# Patient Record
Sex: Female | Born: 1980 | Race: White | Hispanic: No | Marital: Single | Smoking: Former smoker
Health system: Southern US, Community
[De-identification: ages and names within clinical notes are randomized; demographics above are authoritative.]

## PROBLEM LIST (undated history)

## (undated) DIAGNOSIS — F410 Panic disorder [episodic paroxysmal anxiety] without agoraphobia: Secondary | ICD-10-CM

## (undated) DIAGNOSIS — F1910 Other psychoactive substance abuse, uncomplicated: Secondary | ICD-10-CM

## (undated) DIAGNOSIS — I10 Essential (primary) hypertension: Secondary | ICD-10-CM

## (undated) DIAGNOSIS — F418 Other specified anxiety disorders: Secondary | ICD-10-CM

## (undated) DIAGNOSIS — F419 Anxiety disorder, unspecified: Secondary | ICD-10-CM

## (undated) DIAGNOSIS — F329 Major depressive disorder, single episode, unspecified: Secondary | ICD-10-CM

## (undated) DIAGNOSIS — F32A Depression, unspecified: Secondary | ICD-10-CM

## (undated) DIAGNOSIS — K769 Liver disease, unspecified: Secondary | ICD-10-CM

## (undated) HISTORY — PX: CHOLECYSTECTOMY: SHX55

## (undated) HISTORY — DX: Liver disease, unspecified: K76.9

## (undated) HISTORY — DX: Other psychoactive substance abuse, uncomplicated: F19.10

## (undated) HISTORY — DX: Essential (primary) hypertension: I10

## (undated) HISTORY — PX: OTHER SURGICAL HISTORY: SHX169

## (undated) HISTORY — DX: Anxiety disorder, unspecified: F41.9

---

## 1999-09-04 ENCOUNTER — Emergency Department (HOSPITAL_COMMUNITY): Admission: EM | Admit: 1999-09-04 | Discharge: 1999-09-04 | Payer: Self-pay | Admitting: Emergency Medicine

## 1999-09-04 ENCOUNTER — Encounter: Payer: Self-pay | Admitting: Emergency Medicine

## 1999-12-05 ENCOUNTER — Other Ambulatory Visit: Admission: RE | Admit: 1999-12-05 | Discharge: 1999-12-05 | Payer: Self-pay | Admitting: Orthopedic Surgery

## 2001-12-01 ENCOUNTER — Other Ambulatory Visit: Admission: RE | Admit: 2001-12-01 | Discharge: 2001-12-01 | Payer: Self-pay | Admitting: Gynecology

## 2001-12-21 HISTORY — PX: LAPAROSCOPIC GASTRIC BYPASS: SUR771

## 2001-12-21 HISTORY — PX: GASTRIC BYPASS: SHX52

## 2002-12-21 HISTORY — PX: PANNICULECTOMY: SHX5360

## 2003-10-18 ENCOUNTER — Other Ambulatory Visit: Admission: RE | Admit: 2003-10-18 | Discharge: 2003-10-18 | Payer: Self-pay | Admitting: Gynecology

## 2006-07-14 ENCOUNTER — Emergency Department (HOSPITAL_COMMUNITY): Admission: EM | Admit: 2006-07-14 | Discharge: 2006-07-14 | Payer: Self-pay | Admitting: Family Medicine

## 2007-07-15 ENCOUNTER — Other Ambulatory Visit: Admission: RE | Admit: 2007-07-15 | Discharge: 2007-07-15 | Payer: Self-pay | Admitting: Obstetrics and Gynecology

## 2007-12-22 HISTORY — PX: LAPAROSCOPIC CHOLECYSTECTOMY: SUR755

## 2008-02-01 ENCOUNTER — Inpatient Hospital Stay (HOSPITAL_COMMUNITY): Admission: AD | Admit: 2008-02-01 | Discharge: 2008-02-04 | Payer: Self-pay | Admitting: Obstetrics and Gynecology

## 2008-04-08 ENCOUNTER — Inpatient Hospital Stay (HOSPITAL_COMMUNITY): Admission: EM | Admit: 2008-04-08 | Discharge: 2008-04-10 | Payer: Self-pay | Admitting: Emergency Medicine

## 2008-04-09 ENCOUNTER — Encounter (INDEPENDENT_AMBULATORY_CARE_PROVIDER_SITE_OTHER): Payer: Self-pay | Admitting: General Surgery

## 2008-04-09 ENCOUNTER — Encounter: Payer: Self-pay | Admitting: General Surgery

## 2008-04-18 ENCOUNTER — Ambulatory Visit: Payer: Self-pay | Admitting: Gastroenterology

## 2008-08-14 ENCOUNTER — Other Ambulatory Visit: Admission: RE | Admit: 2008-08-14 | Discharge: 2008-08-14 | Payer: Self-pay | Admitting: Obstetrics and Gynecology

## 2009-09-10 IMAGING — US US ABDOMEN COMPLETE
1 series · 14 of 25 positions shown · non-contrast
Comparison: None

CLINICAL DATA: Abdominal pain and vomiting

ABDOMEN ULTRASOUND
TECHNIQUE: Complete abdominal ultrasound examination was performed
including evaluation of the liver, gallbladder, bile ducts,
pancreas, kidneys, spleen, IVC, and abdominal aorta.

[Series 1: unknown · 0.34mm/px · 14 of 75 slices shown]
[im 1/75]
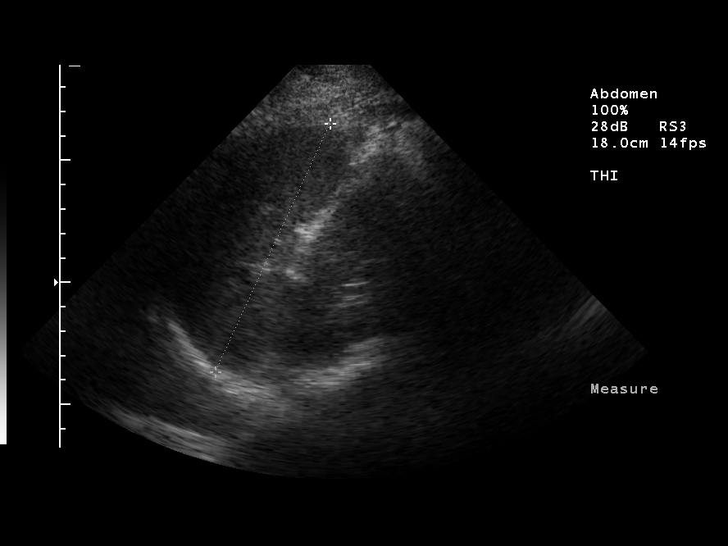
[im 7/75]
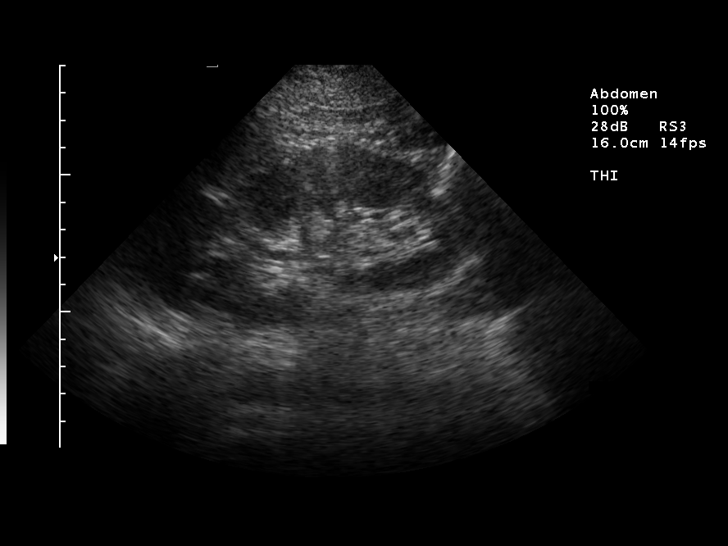
[im 13/75]
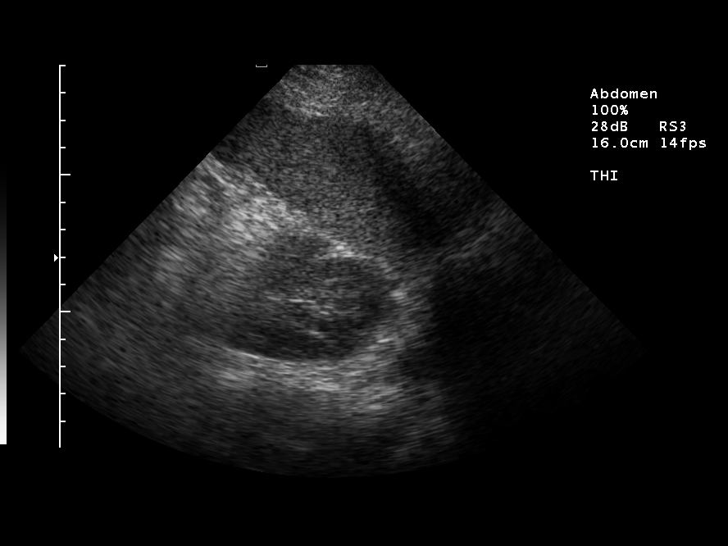
[im 19/75]
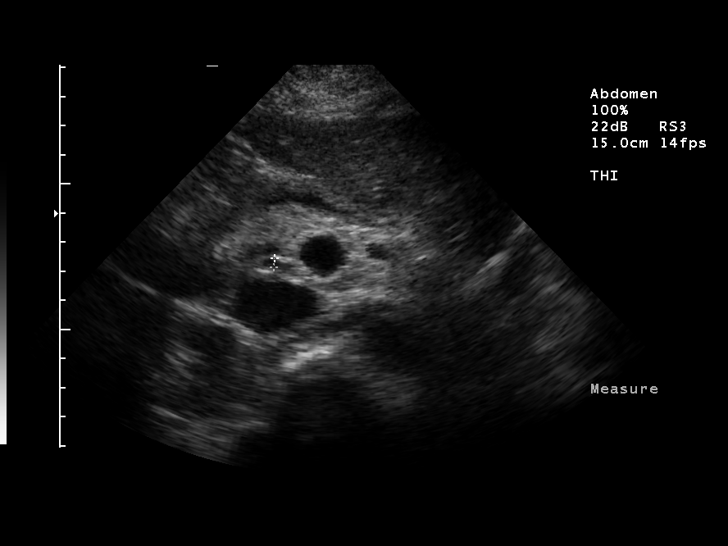
[im 25/75]
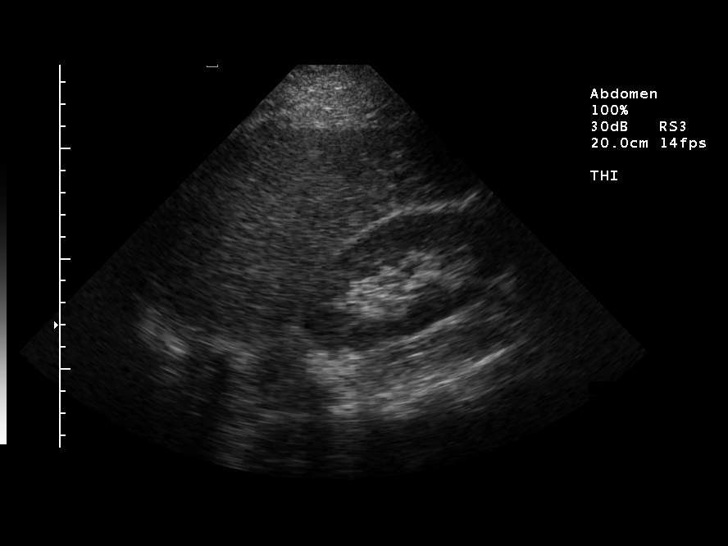
[im 28/75]
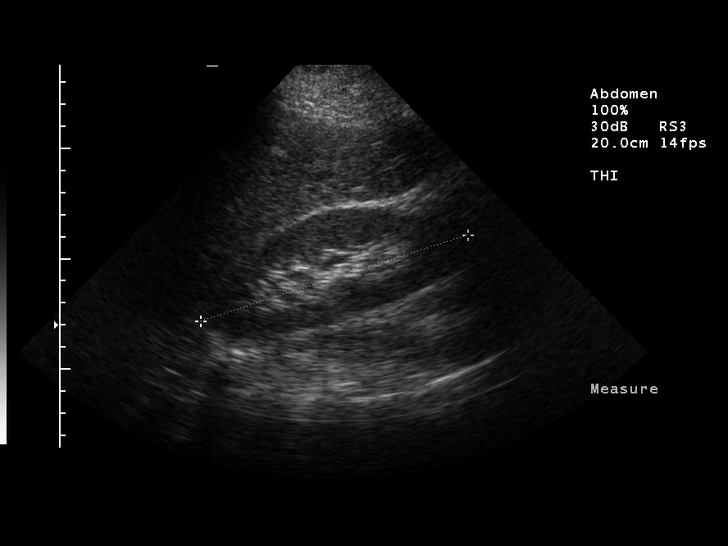
[im 34/75]
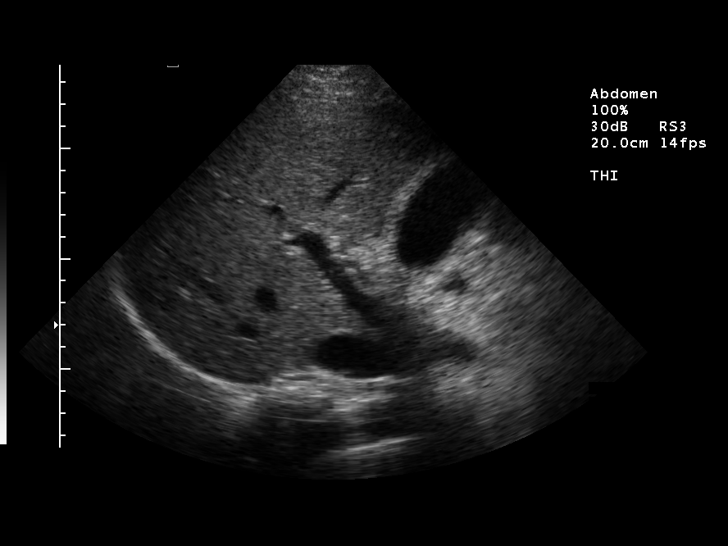
[im 41/75]
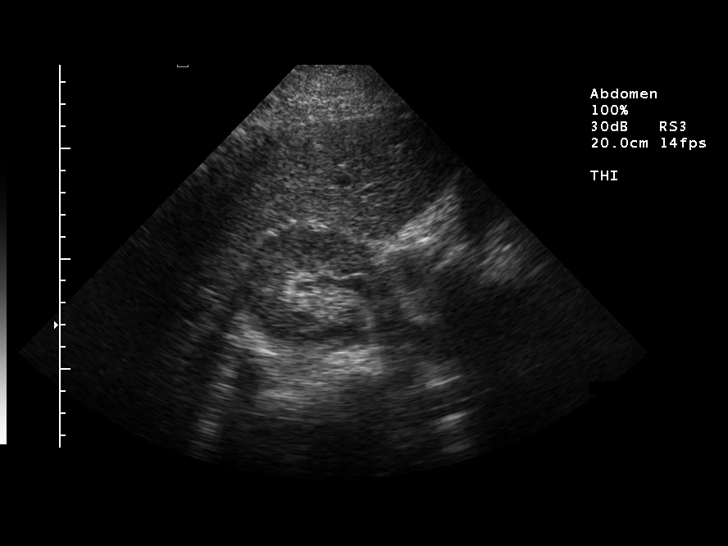
[im 47/75]
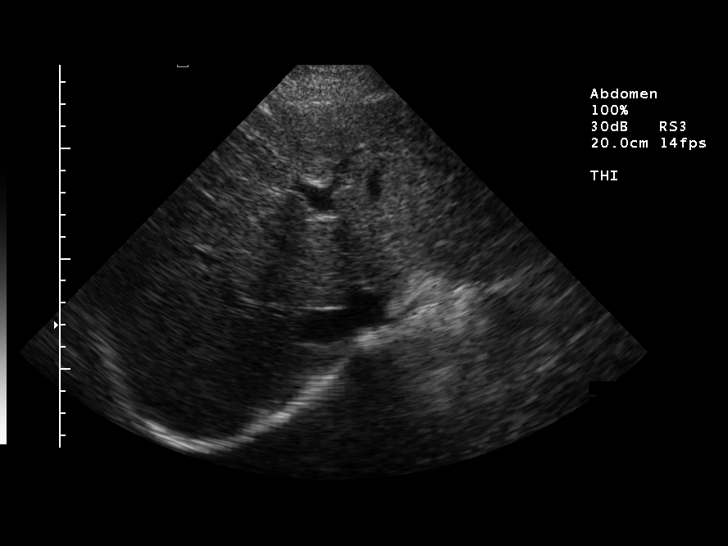
[im 50/75]
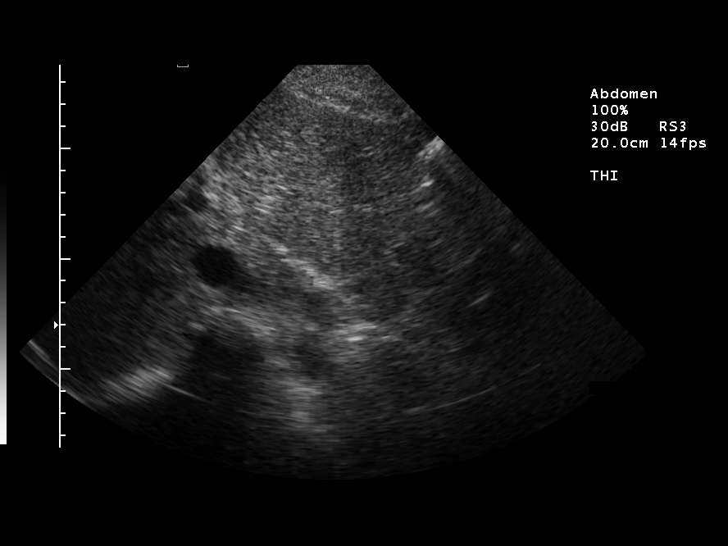
[im 56/75]
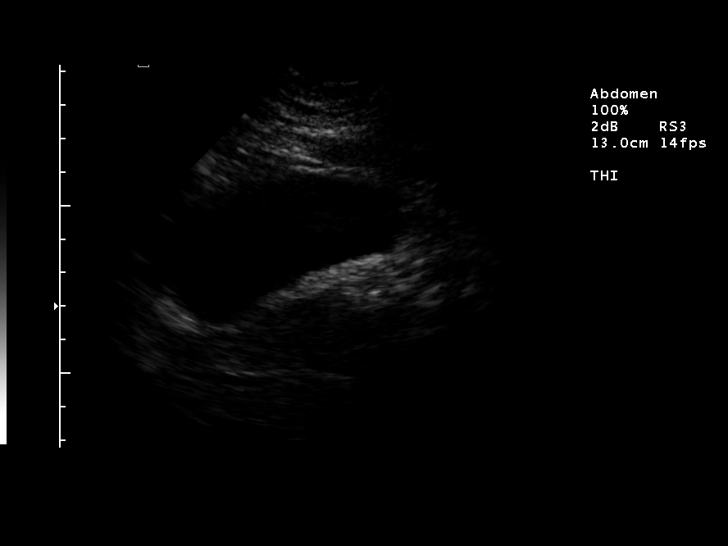
[im 62/75]
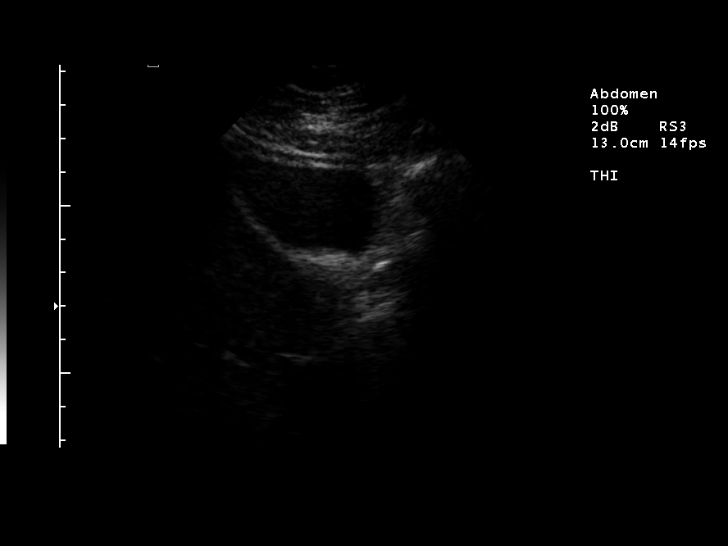
[im 68/75]
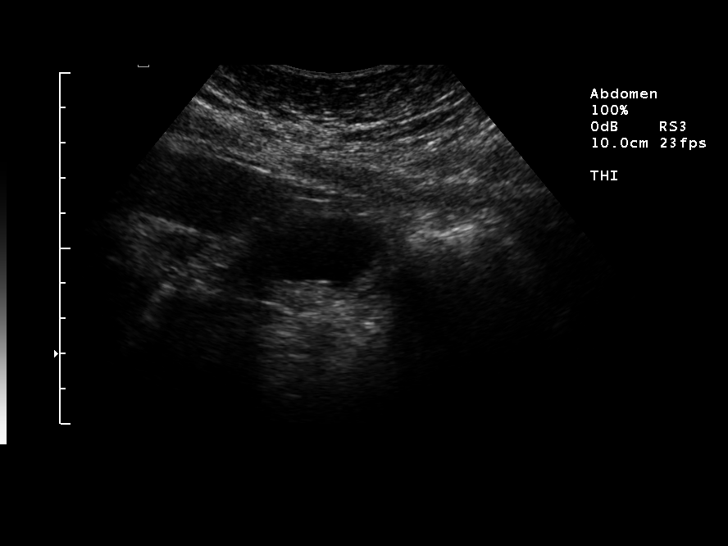
[im 75/75]
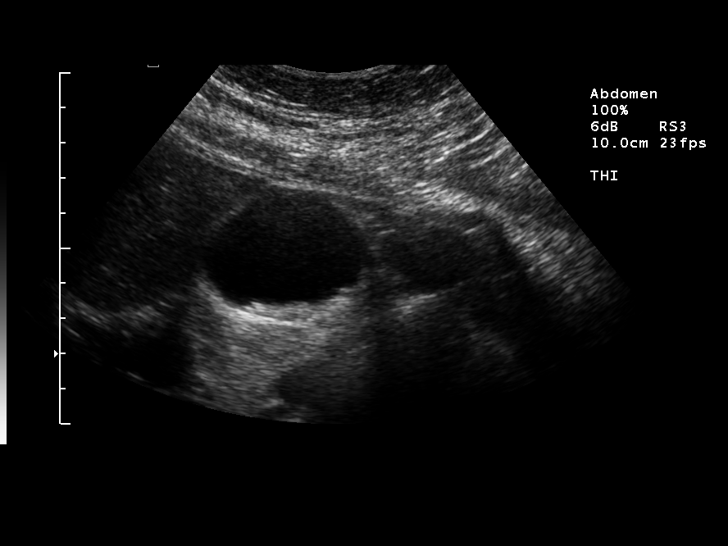

[14 of 25 positions shown; findings below may reference images not displayed]

FINDINGS: Multiple tiny gallstones and small amount of sludge noted
within the gallbladder.  There is no evidence of gallbladder wall
thickening or pericholecystic fluid.  There is no evidence of
biliary ductal dilatation of common bile duct measuring 4 mm.
Liver is normal in parenchymal echogenicity, and no liver lesions
are identified.  Visualized portions of the IVC and pancreas are
unremarkable.

There is no evidence of splenomegaly.  Both kidneys are normal in
size and appearance.  There is no evidence of abdominal aortic
aneurysm.
IMPRESSION: 1.  Tiny gallstones and gallbladder sludge.  No evidence of
gallbladder wall thickening or pericholecystic fluid.
2.  No evidence of biliary ductal dilatation.

## 2011-05-05 NOTE — Op Note (Signed)
NAMEBERLYN, SAYLOR                  ACCOUNT NO.:  [id number]   MEDICAL RECORD NO.:  [id number]          PATIENT TYPE:  INP   LOCATION:  1519                         FACILITY:  [facility]   PHYSICIAN:  Anselm Pancoast. Weatherly, M.D.DATE OF BIRTH:  [birth year]   DATE OF PROCEDURE:  04/09/2008  DATE OF DISCHARGE:                               OPERATIVE REPORT   PREOPERATIVE DIAGNOSIS:  Chronic cholecystitis with jaundice, probable  common duct stone.   POSTOPERATIVE DIAGNOSIS:  Cholecystitis with small stones.  Cholangiogram was essentially normal after initial spasm.   OPERATIONS:  Laparoscopic cholecystectomy with cholangiogram status post  previous Roux-en-Y gastric bypass and also abdominoplasty.   HISTORY:  Tandi Hanko is a 30 year old Caucasian female who presented to  the emergency room on Saturday night with severe episodes of epigastric  pain that ultimately radiated to the back and with the following  history.  She had a gastric bypass at [facility] in 2001.  She had also had  abdominoplasty approximately two years later.  Her weight had gone from  about 275 to about 165, and then recently, she has had a pregnancy which  she delivered vaginally approximately 6 weeks earlier.  She presently  weighs about 200 pounds and described the pain as epigastric, then pain  radiating to her back.  Her liver tests were definitely abnormal and her  SGOT was about 1200.  Her bilirubin was only mildly elevated, and the  pain when I saw her approximately 4:30 a.m. her pain essentially was  greatly decreased.  She had received some IV pain medication.  I advised  that we continue the IVs and repeat a set of liver function studies at  approximately 7:00 a.m., and they he actually drew it at approximately  8:00 a.m., and on the repeat blood work, it was still abnormal.  I have  gone over to [facility] to operate, and Dr. Daphine Deutscher saw her and went  ahead and admitted her and arranged for nuclear medicine ERCP to  be  performed, and this showed thickening of the gallbladder.  She had an  ultrasound that showed small stones and kind of contracted gallbladder  but no dilated common bile duct and no abnormality in the pancreas.  We  got Dr. Loreta Ave to see her, and she of course with the Roux-en-Y is not  able to do an ERCP, and the discussion or whether she should be sent to  [facility] where she had the Roux-en-Y, etc., was kind of discussed.  This  morning, she had repeat liver function studies, and again the bilirubin  now was 3.5, and she is clinically obviously jaundiced, and her other  liver tests are still abnormal.  She was seen again today with Dr. Russella Dar  who was in agreement that we did not really have a good explanation,  thought that this is probably gallstones in nature, and we recommended  to proceed with a laparoscopic cholecystectomy versus an open  cholecystectomy, and if she has common duct stones, we will decide if it  can done laparoscopically or whether an open common duct exploration  will be needed.  With this and the patient in agreement, she was added  to the OR schedule for late this evening.  Preoperatively, she was not  given additional antibiotics since she is receiving antibiotics on the  floor.   Positioned on the OR table, induction general anesthesia endotracheal  tube, and no NG tube.  Did place a Foley catheter, and the abdomen was  prepped with Betadine surgical scrub solution and draped in sterile  manner.  We first used a 5-mm OptiVu and tried to enter through the  upper right quadrant of the abdomen.  I was kind of too cautious, and  Dr. Ezzard Standing who has done this many more times than I have with the OptiVu  and the Roux-en-Y bypass entered the peritoneal cavity without  difficulty, and fortunately there was no adhesions from previous  surgery.  With the OptiVu 5-mm, we could then see in the peritoneal  cavity.  She does have adhesions from where the falciform ligament  had  been taken down, and these were taken down after we got a 10/11 port in  the upper midline under direct vision.  After then the adhesions, we  then placed the second 10-mm port with direct view through the little  incision that goes where the umbilicus had been transplanted and was  guided into the peritoneal cavity easily.  The 5-mm port lateral from  Dr. Ezzard Standing was also placed, and then we carefully took the adhesions  down around this inflamed gallbladder, and she did have subacute  cholecystitis.  The cystic duct could be identified, and this was  encompassed and clipped flush with the cystic duct gallbladder, and then  a little opening made, the [facility] catheter introduced, and held in place  with a clip.  I had taken down the cystic artery under direct vision,  double clipped it proximally and singly distally and divided it before  doing cholangiogram.  With injection of the dye, the cystic duct enters  kind of low, and at first, the bile duct was kind of dilated, and you  could not see any flow into the duodenum, and then there was kind of a  pop radiologically looking at it, and dye just freely goes to the  duodenum afterwards easily.  We did a second view and watched the dye in  the duodenum going down into the more distal portion of the small bowel,  could not actually follow it down to the Roux-en-Y limb, however.  I  then removed the catheter, triply clipped the cystic duct and divided  it, and then very consciously removed the gallbladder from its bed  predominantly with hook electrocautery, good hemostasis being obtained.  Fluid was irrigated and no evidence of any active bleeding.  Where we  had taken down adhesions appeared dry, and we placed the gallbladder in  the EndoCatch bag.  I then switched the camera to the upper 10 mL port  and grabbed the bag containing the gallbladder, brought it through the  fascia, and then used a Kelly on the outside of the bag and kind of   stretched the opening since it had been placed percutaneously and the  bag with the gallbladder slipped on out easily.  I later opened the  gallbladder on the back table, and there are little stones about a  millimeter or so in size, and I expect that she had a little stone that  was kind impacted in the distal bile duct.  The  radiologist, Dr. Rosalie Gums reviewed the cholangiogram, and she thought it was unremarkable.  The irrigating and then the little fluid had been aspirated.  The 5-mm  ports were removed under direct vision, and we used Endoloop to put two  sutures in the little fascial defect.  The 10-mm port in the upper  abdomen was withdrawn.  No sutures were place there.  The subcutaneous  wounds were closed with 4-0 Vicryl, Benzoin and Steri-Strips on skin.  The patient tolerated the procedure nicely, and we will repeat liver  function studies in the morning.  Hopefully, she will not have much  pain, and if there is any improvement in the liver function studies, we  will let her be discharged and seen in the office in approximately one  week.           ______________________________  Anselm Pancoast. Zachery Dakins, M.D.     WJW/MEDQ  D:  04/09/2008  T:  04/09/2008  Job:  [id number]   cc:   Venita Lick. Russella Dar, MD, FACG  [address]  [place]  [place] [address]

## 2011-09-11 LAB — CREATININE, SERUM
Creatinine, Ser: 0.74
GFR calc Af Amer: >60

## 2011-09-11 LAB — CBC
HCT: 40.2
Hemoglobin: 13.8
MCHC: 34.4
MCHC: 34.7
MCV: 95.5
MCV: 96.3
Platelets: 163
Platelets: 198
RBC: 4.21
RDW: 12.6
WBC: 11.3 — ABNORMAL HIGH

## 2011-09-15 LAB — COMPREHENSIVE METABOLIC PANEL
ALT: 294 — ABNORMAL HIGH
ALT: 851 — ABNORMAL HIGH
AST: 100 — ABNORMAL HIGH
AST: 1032 — ABNORMAL HIGH
AST: 1172 — ABNORMAL HIGH
Albumin: 3.2 — ABNORMAL LOW
Albumin: 3.9
Albumin: 4
Alkaline Phosphatase: 152 — ABNORMAL HIGH
BUN: 4 — ABNORMAL LOW
CO2: 26
CO2: 26
CO2: 27
Calcium: 8.6
Calcium: 8.8
Chloride: 101
Chloride: 102
Chloride: 104
Creatinine, Ser: 0.65
Creatinine, Ser: 0.82
GFR calc Af Amer: >60
GFR calc Af Amer: >60
GFR calc Af Amer: >60
GFR calc non Af Amer: >60
GFR calc non Af Amer: >60
GFR calc non Af Amer: >60
Glucose, Bld: 120 — ABNORMAL HIGH
Potassium: 4.2
Potassium: 4.4
Sodium: 135
Sodium: 138
Total Bilirubin: 1.5 — ABNORMAL HIGH
Total Bilirubin: 2.8 — ABNORMAL HIGH
Total Protein: 6.1

## 2011-09-15 LAB — DIFFERENTIAL
Basophils Absolute: 0
Eosinophils Relative: 0
Lymphocytes Relative: 7 — ABNORMAL LOW
Monocytes Absolute: 0.5
Monocytes Relative: 5

## 2011-09-15 LAB — URINALYSIS, ROUTINE W REFLEX MICROSCOPIC
Glucose, UA: NEGATIVE
Hgb urine dipstick: NEGATIVE
Specific Gravity, Urine: 1.023
pH: 6.5

## 2011-09-15 LAB — CBC
MCV: 93.4
Platelets: 219
WBC: 9.3

## 2015-12-19 ENCOUNTER — Emergency Department (HOSPITAL_COMMUNITY)
Admission: EM | Admit: 2015-12-19 | Discharge: 2015-12-19 | Disposition: A | Discharge status: Other Acute Inpatient Hospital | Payer: BC Managed Care – PPO | Attending: Emergency Medicine | Admitting: Emergency Medicine

## 2015-12-19 ENCOUNTER — Inpatient Hospital Stay (HOSPITAL_COMMUNITY)
Admission: AD | Admit: 2015-12-19 | Discharge: 2015-12-23 | DRG: 885 | Disposition: A | Discharge status: Home / Self Care | Payer: BC Managed Care – PPO | Source: Intra-hospital | Attending: Psychiatry | Admitting: Psychiatry

## 2015-12-19 ENCOUNTER — Encounter (HOSPITAL_COMMUNITY): Payer: Self-pay | Admitting: Emergency Medicine

## 2015-12-19 ENCOUNTER — Encounter (HOSPITAL_COMMUNITY): Payer: Self-pay

## 2015-12-19 DIAGNOSIS — F17200 Nicotine dependence, unspecified, uncomplicated: Secondary | ICD-10-CM | POA: Diagnosis present

## 2015-12-19 DIAGNOSIS — X58XXXA Exposure to other specified factors, initial encounter: Secondary | ICD-10-CM | POA: Insufficient documentation

## 2015-12-19 DIAGNOSIS — F332 Major depressive disorder, recurrent severe without psychotic features: Secondary | ICD-10-CM | POA: Diagnosis present

## 2015-12-19 DIAGNOSIS — F1010 Alcohol abuse, uncomplicated: Secondary | ICD-10-CM

## 2015-12-19 DIAGNOSIS — F322 Major depressive disorder, single episode, severe without psychotic features: Principal | ICD-10-CM | POA: Diagnosis present

## 2015-12-19 DIAGNOSIS — Z9884 Bariatric surgery status: Secondary | ICD-10-CM | POA: Diagnosis not present

## 2015-12-19 DIAGNOSIS — Y998 Other external cause status: Secondary | ICD-10-CM | POA: Insufficient documentation

## 2015-12-19 DIAGNOSIS — Y9389 Activity, other specified: Secondary | ICD-10-CM | POA: Diagnosis not present

## 2015-12-19 DIAGNOSIS — R112 Nausea with vomiting, unspecified: Secondary | ICD-10-CM | POA: Diagnosis not present

## 2015-12-19 DIAGNOSIS — Y9289 Other specified places as the place of occurrence of the external cause: Secondary | ICD-10-CM | POA: Insufficient documentation

## 2015-12-19 DIAGNOSIS — F411 Generalized anxiety disorder: Secondary | ICD-10-CM | POA: Diagnosis present

## 2015-12-19 DIAGNOSIS — F325 Major depressive disorder, single episode, in full remission: Secondary | ICD-10-CM | POA: Diagnosis present

## 2015-12-19 DIAGNOSIS — T50902A Poisoning by unspecified drugs, medicaments and biological substances, intentional self-harm, initial encounter: Secondary | ICD-10-CM

## 2015-12-19 DIAGNOSIS — Z3202 Encounter for pregnancy test, result negative: Secondary | ICD-10-CM | POA: Insufficient documentation

## 2015-12-19 DIAGNOSIS — T424X2A Poisoning by benzodiazepines, intentional self-harm, initial encounter: Secondary | ICD-10-CM | POA: Insufficient documentation

## 2015-12-19 DIAGNOSIS — F331 Major depressive disorder, recurrent, moderate: Secondary | ICD-10-CM | POA: Diagnosis present

## 2015-12-19 DIAGNOSIS — R45851 Suicidal ideations: Secondary | ICD-10-CM | POA: Diagnosis not present

## 2015-12-19 HISTORY — DX: Depression, unspecified: F32.A

## 2015-12-19 HISTORY — DX: Panic disorder (episodic paroxysmal anxiety): F41.0

## 2015-12-19 HISTORY — DX: Major depressive disorder, single episode, unspecified: F32.9

## 2015-12-19 LAB — HEPATIC FUNCTION PANEL
ALK PHOS: 68 U/L (ref 38–126)
ALT: 87 U/L — AB (ref 14–54)
AST: 119 U/L — ABNORMAL HIGH (ref 15–41)
Albumin: 3.9 g/dL (ref 3.5–5.0)
BILIRUBIN DIRECT: 0.2 mg/dL (ref 0.1–0.5)
BILIRUBIN INDIRECT: 0.4 mg/dL (ref 0.3–0.9)
BILIRUBIN TOTAL: 0.6 mg/dL (ref 0.3–1.2)
Total Protein: 7.2 g/dL (ref 6.5–8.1)

## 2015-12-19 LAB — CBC WITH DIFFERENTIAL/PLATELET
BASOS ABS: 0 10*3/uL (ref 0.0–0.1)
Basophils Relative: 1 %
EOS ABS: 0.1 10*3/uL (ref 0.0–0.7)
EOS PCT: 2 %
HCT: 42.7 % (ref 36.0–46.0)
Hemoglobin: 15 g/dL (ref 12.0–15.0)
LYMPHS PCT: 41 %
Lymphs Abs: 2.7 10*3/uL (ref 0.7–4.0)
MCH: 36.8 pg — ABNORMAL HIGH (ref 26.0–34.0)
MCHC: 35.1 g/dL (ref 30.0–36.0)
MCV: 104.7 fL — AB (ref 78.0–100.0)
MONO ABS: 0.7 10*3/uL (ref 0.1–1.0)
Monocytes Relative: 11 %
Neutro Abs: 3 10*3/uL (ref 1.7–7.7)
Neutrophils Relative %: 45 %
PLATELETS: 217 10*3/uL (ref 150–400)
RBC: 4.08 MIL/uL (ref 3.87–5.11)
RDW: 13.2 % (ref 11.5–15.5)
WBC: 6.5 10*3/uL (ref 4.0–10.5)

## 2015-12-19 LAB — RAPID URINE DRUG SCREEN, HOSP PERFORMED
AMPHETAMINES: NOT DETECTED
BARBITURATES: NOT DETECTED
BENZODIAZEPINES: NOT DETECTED
COCAINE: NOT DETECTED
Opiates: NOT DETECTED
TETRAHYDROCANNABINOL: NOT DETECTED

## 2015-12-19 LAB — ETHANOL: ALCOHOL ETHYL (B): 196 mg/dL — AB (ref <5)

## 2015-12-19 LAB — I-STAT CHEM 8, ED
BUN: <3 mg/dL — ABNORMAL LOW (ref 6–20)
CALCIUM ION: 1.09 mmol/L — AB (ref 1.12–1.23)
Chloride: 104 mmol/L (ref 101–111)
Creatinine, Ser: 0.9 mg/dL (ref 0.44–1.00)
GLUCOSE: 105 mg/dL — AB (ref 65–99)
HCT: 48 % — ABNORMAL HIGH (ref 36.0–46.0)
HEMOGLOBIN: 16.3 g/dL — AB (ref 12.0–15.0)
POTASSIUM: 3.3 mmol/L — AB (ref 3.5–5.1)
Sodium: 143 mmol/L (ref 135–145)
TCO2: 23 mmol/L (ref 0–100)

## 2015-12-19 LAB — SALICYLATE LEVEL: Salicylate Lvl: <4 mg/dL (ref 2.8–30.0)

## 2015-12-19 LAB — POC URINE PREG, ED: PREG TEST UR: NEGATIVE

## 2015-12-19 LAB — ACETAMINOPHEN LEVEL: Acetaminophen (Tylenol), Serum: <10 ug/mL — ABNORMAL LOW (ref 10–30)

## 2015-12-19 MED ORDER — ONDANSETRON HCL 4 MG PO TABS: 4.0000 mg | ORAL_TABLET | Freq: Three times a day (TID) | ORAL | Status: DC | PRN

## 2015-12-19 MED ORDER — ACETAMINOPHEN 325 MG PO TABS: 650.0000 mg | ORAL_TABLET | ORAL | Status: DC | PRN

## 2015-12-19 MED ORDER — IBUPROFEN 600 MG PO TABS
600.0000 mg | ORAL_TABLET | Freq: Three times a day (TID) | ORAL | Status: DC | PRN
  Administered 2015-12-20: 600 mg via ORAL
  Filled 2015-12-19: qty 1

## 2015-12-19 MED ORDER — SODIUM CHLORIDE 0.9 % IV BOLUS (SEPSIS)
1000.0000 mL | Freq: Once | INTRAVENOUS | Status: AC
  Administered 2015-12-19: 1000 mL via INTRAVENOUS

## 2015-12-19 MED ORDER — IBUPROFEN 200 MG PO TABS
600.0000 mg | ORAL_TABLET | Freq: Three times a day (TID) | ORAL | Status: DC | PRN
  Administered 2015-12-19: 600 mg via ORAL
  Filled 2015-12-19: qty 3

## 2015-12-19 MED ORDER — SODIUM CHLORIDE 0.9 % IV BOLUS (SEPSIS)
1000.0000 mL | Freq: Once | INTRAVENOUS | Status: AC
Start: 2015-12-19 — End: 2015-12-19
  Administered 2015-12-19: 1000 mL via INTRAVENOUS

## 2015-12-19 MED ORDER — HYDROXYZINE HCL 25 MG PO TABS: 25.0000 mg | ORAL_TABLET | Freq: Three times a day (TID) | ORAL | Status: DC | PRN

## 2015-12-19 MED ORDER — ALUM & MAG HYDROXIDE-SIMETH 200-200-20 MG/5ML PO SUSP: 30.0000 mL | ORAL | Status: DC | PRN

## 2015-12-19 MED ORDER — IBUPROFEN 200 MG PO TABS: 600.0000 mg | ORAL_TABLET | Freq: Three times a day (TID) | ORAL | Status: DC | PRN

## 2015-12-19 MED ORDER — SERTRALINE HCL 50 MG PO TABS
50.0000 mg | ORAL_TABLET | Freq: Every day | ORAL | Status: DC
  Administered 2015-12-19: 50 mg via ORAL
  Filled 2015-12-19: qty 1

## 2015-12-19 NOTE — ED Notes (Signed)
Patty from poison control stated she was closing patient case. Medically cleared from poison control.

## 2015-12-19 NOTE — ED Notes (Signed)
Patient admits to SI but denies HI and AVH. Plan of care discussed with patient. Patient voices no complaints or concerns at this time. Patient given a pitcher of ice water. Encouragement and support provided and safety maintain. Q 15 min safety checks remain in place.

## 2015-12-19 NOTE — ED Notes (Signed)
Added pt's earrings to personal belongings bag.  Pt escorted to [facility].

## 2015-12-19 NOTE — ED Notes (Signed)
Pt shirt, leggings, boots, coat , small pocket book, cell phone with charger and 2 banks cards and [place] ID all stuck in the case of cell phone all placed in personal belongings bag placed at nurses station by room #17
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
## 2015-12-19 NOTE — ED Notes (Signed)
Pt is resting comfortably in bed, in view of the nurse's station, in no acute distress.  Vitals remain WNL.  Belongings and room have been secured.

## 2015-12-19 NOTE — ED Notes (Signed)
Family at bedside per pt request.  Will draw labs when visitor leaves.

## 2015-12-19 NOTE — BH Assessment (Signed)
[facility] Assessment Progress Note  Per Thedore MinsMojeed Akintayo, MD, this pt requires psychiatric hospitalization at this time.  Brandi Heinrichina Tate, RN, [facility].  Pt has signed Voluntary Admission and Consent for Treatment, as well as Consent to Release Information to [facility], her former outpatient provider, and [facility].  Pt's nurse, Rudean HittDawnaly, has been notified, and agrees to send original paperwork along with pt via [facility], and to call report to [phone].  Doylene Canninghomas Daysie Helf, MA Triage Specialist [phone]
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
## 2015-12-19 NOTE — ED Notes (Signed)
Patient discharged to [facility].  Left the unit ambulatory with [facility] [employer].  All belongings given to the driver.  Patient continues to be tearful at times and states she realizes she needs help and has waited too long to admit it.

## 2015-12-19 NOTE — ED Notes (Addendum)
Called Poison Control; per CJ, if labs are normal then we should expect the pt to be lethargic for several more hours.  Provide supportive care until she is awake.  If labs are abnormal, call back for additional recommendations.

## 2015-12-19 NOTE — Progress Notes (Signed)
Adult Psychoeducational Group Note  Date:  12/19/2015 Time:  10:27 PM  Group Topic/Focus:  Wrap-Up Group:   The focus of this group is to help patients review their daily goal of treatment and discuss progress on daily workbooks.  Participation Level:  Active  Participation Quality:  Appropriate  Affect:  Appropriate  Cognitive:  Alert  Insight: Appropriate  Engagement in Group:  Engaged  Modes of Intervention:  Discussion  Additional Comments:  Patient stated her goal for today was to be positive, and to set goals of what she expects in the future. Patient stated something positive that happened today was her parents coming to visit.   Osa Fogarty L Khriz Liddy 12/19/2015, 10:27 PM
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
## 2015-12-19 NOTE — ED Notes (Signed)
Bed: WA17 Expected date:  Expected time:  Means of arrival:  Comments: EMS 8636 F ingestion (15) 1mg  Klonopin/etoh

## 2015-12-20 ENCOUNTER — Encounter (HOSPITAL_COMMUNITY): Payer: Self-pay | Admitting: Psychiatry

## 2015-12-20 DIAGNOSIS — R45851 Suicidal ideations: Secondary | ICD-10-CM

## 2015-12-20 DIAGNOSIS — F411 Generalized anxiety disorder: Secondary | ICD-10-CM

## 2015-12-20 DIAGNOSIS — F332 Major depressive disorder, recurrent severe without psychotic features: Secondary | ICD-10-CM | POA: Diagnosis present

## 2015-12-20 MED ORDER — LORAZEPAM 1 MG PO TABS: 1.0000 mg | ORAL_TABLET | Freq: Four times a day (QID) | ORAL | Status: DC | PRN

## 2015-12-20 MED ORDER — ADULT MULTIVITAMIN W/MINERALS CH
1.0000 | ORAL_TABLET | Freq: Every day | ORAL | Status: DC
  Administered 2015-12-20 – 2015-12-23 (×4): 1 via ORAL
  Filled 2015-12-20 (×7): qty 1

## 2015-12-20 MED ORDER — VITAMIN B-1 100 MG PO TABS
100.0000 mg | ORAL_TABLET | Freq: Every day | ORAL | Status: DC
  Administered 2015-12-21 – 2015-12-23 (×3): 100 mg via ORAL
  Filled 2015-12-20 (×5): qty 1

## 2015-12-20 MED ORDER — HYDROXYZINE HCL 25 MG PO TABS: 25.0000 mg | ORAL_TABLET | Freq: Four times a day (QID) | ORAL | Status: DC | PRN

## 2015-12-20 MED ORDER — THIAMINE HCL 100 MG/ML IJ SOLN
100.0000 mg | Freq: Once | INTRAMUSCULAR | Status: AC
  Administered 2015-12-20: 100 mg via INTRAMUSCULAR
  Filled 2015-12-20: qty 2

## 2015-12-20 MED ORDER — POTASSIUM CHLORIDE CRYS ER 10 MEQ PO TBCR
10.0000 meq | EXTENDED_RELEASE_TABLET | Freq: Two times a day (BID) | ORAL | Status: AC
  Administered 2015-12-20 – 2015-12-21 (×3): 10 meq via ORAL
  Filled 2015-12-20 (×3): qty 1

## 2015-12-20 MED ORDER — SERTRALINE HCL 50 MG PO TABS
50.0000 mg | ORAL_TABLET | Freq: Every day | ORAL | Status: DC
  Administered 2015-12-20 – 2015-12-23 (×4): 50 mg via ORAL
  Filled 2015-12-20: qty 1
  Filled 2015-12-20: qty 7
  Filled 2015-12-20 (×5): qty 1

## 2015-12-20 MED ORDER — LOPERAMIDE HCL 2 MG PO CAPS
2.0000 mg | ORAL_CAPSULE | ORAL | Status: DC | PRN
Start: 2015-12-20 — End: 2015-12-23

## 2015-12-20 MED ORDER — ONDANSETRON 4 MG PO TBDP: 4.0000 mg | ORAL_TABLET | Freq: Four times a day (QID) | ORAL | Status: DC | PRN

## 2015-12-20 NOTE — Progress Notes (Signed)
D: Brandi DandyMary is resting in her room. She denies SI/HI/AVH. Contracts for safety. She attended group this evening however she was late due to napping earlier in the day. A: Q 15 min checks for patient safety. Encouragement and support given.  R: Continue to monitor for patient safety.
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
## 2015-12-20 NOTE — [facility] Suicide Risk Assessment (Addendum)
[facility] INPATIENT:  Family/Significant Other Suicide Prevention Education  Suicide Prevention Education:  Contact Attempts Carlie Dunstan-Sister [phone]) has been identified by the patient as the family member/significant other with whom the patient will be residing, and identified as the person(s) who will aid the patient in the event of a mental health crisis.  With written consent from the patient, two attempts were made to provide suicide prevention education, prior to and/or following the patient's discharge.  We were unsuccessful in providing suicide prevention education.  A suicide education pamphlet was given to the patient to share with family/significant other.  Date and time of first attempt: 12/20/15 11:40 AM Date and time of second attempt: 12/20/15 15:58 PM  Attempt made third time by Brandi MantleMareida Grossman-Orr, Brandi James - 12/22/15 at 4:05 PM  [name], [name] 12/20/2015, 11:39 AM

## 2015-12-20 NOTE — [facility] Group Notes (Signed)
[facility] LCSW Group Therapy  12/20/2015 2:27 PM  Type of Therapy: Group Therapy- Feelings Around Relapse and Recovery  Participation Level: Active   Participation Quality: Appropriate  Affect: Appropriate  Cognitive: Alert and Oriented   Insight: Developing   Engagement in Therapy: Developing/Improving and Engaged   Modes of Intervention: Clarification, Confrontation, Discussion, Education, Exploration, Limit-setting, Orientation, Problem-solving, Rapport Building, Dance movement psychotherapisteality Testing, Socialization and Support  Summary of Progress/Problems: The topic for today was feelings about relapse. The group discussed what relapse prevention is to them and identified triggers that they are on the path to relapse. Members also processed their feeling towards relapse and were able to relate to common experiences. Group also discussed coping skills that can be used for relapse prevention. Corrie DandyMary was observed to be tearful in group as she discussed her trigger to relapse which was grief. She shared how her ex-husband passed away unexpectedly and that she has not been able to work through his passing due to focusing on helping her son get through this challenging time. Corrie DandyMary ended group stating her desire to follow through grief counseling in order to resolve some of these issues.    Therapeutic Modalities:  Cognitive Behavioral Therapy Solution-Focused Therapy Assertiveness Training Relapse Prevention Therapy  [name], Henery Betzold C 12/20/2015, 2:27 PM

## 2015-12-20 NOTE — [facility] Group Notes (Signed)
[facility]  12/20/2015 8:45 AM  Participation Quality: Alert, Appropriate and Oriented  Mood/Affect: Appropriate  Depression Rating: 0  Anxiety Rating: 0  Thoughts of Suicide: Pt denies SI/HI  Will you contract for safety? Yes  Current AVH: Pt denies  Plan for Discharge/Comments: Pt attended discharge planning group and actively participated in group. CSW discussed suicide prevention education with the group and encouraged them to discuss discharge planning and any relevant barriers. Pt reports a good night's sleep and expresses that she is feeling much better this morning.   Transportation Means: Pt reports access to transportation  Supports: No supports mentioned at this time  Chad CordialLauren Carter, LCSWA 12/20/2015 10:06 AM

## 2015-12-20 NOTE — Progress Notes (Signed)
Adult Psychoeducational Group Note  Date:  12/20/2015 Time:  9:05 PM  Group Topic/Focus:  Wrap-Up Group:   The focus of this group is to help patients review their daily goal of treatment and discuss progress on daily workbooks.  Participation Level:  Active  Participation Quality:  Appropriate  Affect:  Appropriate  Cognitive:  Alert  Insight: Appropriate  Engagement in Group:  Engaged  Modes of Intervention:  Discussion  Additional Comments:  Patient stated having a good day. Patient stated she had a good time associating with other patients. Patient also stated her family came to visit her today.   August Gosser L Konnar Ben 12/20/2015, 9:05 PM
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
## 2015-12-20 NOTE — Tx Team (Signed)
Interdisciplinary Treatment Plan Update (Adult) Date: 12/20/2015   Date: 12/20/2015 11:05 AM  Progress in Treatment:  Attending groups: Yes  Participating in groups: Yes  Taking medication as prescribed: Yes  Tolerating medication: Yes  Family/Significant othe contact made: No, CSW attempting to make contact with sister Patient understands diagnosis: Yes Discussing patient identified problems/goals with staff: Yes  Medical problems stabilized or resolved: Yes  Denies suicidal/homicidal ideation: Yes Patient has not harmed self or Others: Yes   New problem(s) identified: None identified at this time.   Discharge Plan or Barriers: Pt will return home and follow-up with [facility] and [place]  Additional comments:  Patient and CSW reviewed pt's identified goals and treatment plan. Patient verbalized understanding and agreed to treatment plan. CSW reviewed [facility] "Discharge Process and Patient Involvement" Form. Pt verbalized understanding of information provided and signed form.   Reason for Continuation of Hospitalization:  Anxiety Depression Medication stabilization Suicidal ideation   Estimated length of stay: 3-5 days  Review of initial/current patient goals per problem list:   1.  Goal(s): Patient will participate in aftercare plan  Met:  Yes  Target date: 3-5 days from date of admission   As evidenced by: Patient will participate within aftercare plan AEB aftercare provider and housing plan at discharge being identified.   12/20/15: Pt will return home and follow-up with [facility] [place]  2.  Goal (s): Patient will exhibit decreased depressive symptoms and suicidal ideations.  Met:  Yes  Target date: 3-5 days from date of admission   As evidenced by: Patient will utilize self rating of depression at 3 or below and demonstrate decreased signs of depression or be deemed stable for discharge by MD.  12/20/15: Pt rates depression  at 0/10; denies SI. Observed to be tearful  3.  Goal(s): Patient will demonstrate decreased signs and symptoms of anxiety.  Met:  Progressing  Target date: 3-5 days from date of admission   As evidenced by: Patient will utilize self rating of anxiety at 3 or below and demonstrated decreased signs of anxiety, or be deemed stable for discharge by MD  12/20/15: Pt rates anxiety at 0/10  Attendees:  Patient:    Family:    Physician: Dr. Parke Poisson, MD  12/20/2015 11:05 AM  Nursing: Lars Pinks, RN Case manager  12/20/2015 11:05 AM  Clinical Social Worker Peri Maris, [place] 12/20/2015 11:05 AM  Other: Boyce Medici, LCSW 12/20/2015 11:05 AM  Clinical:  Gaylan Gerold, RN 12/20/2015 11:05 AM  Other: , RN Charge Nurse 12/20/2015 11:05 AM  Other:     [place] Cage, Jonah Nestle C 12/20/2015

## 2015-12-20 NOTE — Progress Notes (Signed)
D:Madelyne states today was "good". When I questioned what made it good she states "I was more sociable today with my peers and that is a good change for me". She continues to work on her goals and coping skills. She states "Right now I am in a controlled environment and things are easy. I need to work on how I am going to cope with stress outside of a controlled environment. She rates Anxiety 1/10 Depression 1/10 and Hopelessness 0/10. Denies SI/HI/AVH at this time.  A: Q 15 minute checks for patient safety. Encouragement and support given.  R: Continue to monitor for patient safety and medication effectiveness.

## 2015-12-20 NOTE — Progress Notes (Signed)
Patient ID: Brandi CortiMary E Mcenroe, female   DOB: 1981/03/22, 34 y.o.   MRN: [id number]  Pt currently presents with an anxious affect and pleasant behavior. Per self inventory, pt rates depression at a 8, hopelessness 0 and anxiety 4. Pt's daily goal is to "make a plan of positive changes/shedule - work on positive thinking" and they intend to do so by "write it down! Make daily goal/short long term." Pt reports good sleep, a fair appetite, normal energy and good concentration. Pt also writes "Holidays were rough, more than usual after loss/death and helping son grieve with my own grief has been difficult."  Pt provided with medications per providers orders. Pt's labs and vitals were monitored throughout the day. Pt supported emotionally and encouraged to express concerns and questions. Pt educated on medications.  Pt's safety ensured with 15 minute and environmental checks. Pt currently denies SI/HI and A/V hallucinations. Pt verbally agrees to seek staff if SI/HI or A/VH occurs and to consult with staff before acting on these thoughts. Pt is an active part in the milieu today, attends groups. Will continue POC.
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
## 2015-12-21 LAB — TSH: TSH: 4.036 u[IU]/mL (ref 0.350–4.500)

## 2015-12-21 NOTE — Progress Notes (Signed)
D) Pt has been attending the groups and interacting with her peers. Affect and mood are appropriate. Pt rates her depression between a 0-1, hopelessness at a 0 and her anxiety as a 0-1. Has participated in the groups. Denies SI and HI.  A) Pt given support, reassurance and praise. Encouragement provided. Provided with a 1:1. R) Denies SI and HI.

## 2015-12-21 NOTE — [facility] Group Notes (Signed)
Adult Psychoeducational Group Note  Date:  12/21/2015 Time:  9:27 PM  Group Topic/Focus:  Wrap-Up Group:   The focus of this group is to help patients review their daily goal of treatment and discuss progress on daily workbooks.  Participation Level:  Active  Participation Quality:  Appropriate  Affect:  Appropriate  Cognitive:  Appropriate  Insight: Good  Engagement in Group:  Engaged  Modes of Intervention:  Discussion  Additional Comments:  Patient stated her day was good and she laughed James lot.  She is discharging tomorrow.  Her goal for the new year is to make better decisions.    [name], Brandi James 12/21/2015, 9:27 PM

## 2015-12-21 NOTE — [facility] Group Notes (Signed)
[facility] Group Notes:  (Nursing/MHT/Case Management/Adjunct)  Date:  12/21/2015  Time:  2:27 PM  Type of Therapy:  Psychoeducational Skills  Participation Level:  Active  Participation Quality:  Appropriate  Affect:  Appropriate  Cognitive:  Appropriate  Insight:  Appropriate  Engagement in Group:  Engaged  Modes of Intervention:  Discussion  Summary of Progress/Problems: Pt did attend self inventory group.   Akash Winski Shanta 12/21/2015, 2:27 PM 

## 2015-12-21 NOTE — Progress Notes (Signed)
[facility]  12/21/2015 4:01 PM Brandi CortiMary E James  MRN:  [id number]  Subjective:  Brandi DandyMary says, "I'm feeling a lot better. I'm here because I thought I took too many of my anxiety pills. I was drinking at the time. I wanted to sleep too. When I checked my bottle, it was almost empty. I panicked, thinking I had taken these pills. I called for help. I'm really not doing well at the time. I was dealing with grief. My son's father passed away in July of this year of heart attack. A good father, good man & a good friend. I miss him a lot. I'm doing much better. I signed my 72 hours already, will be due in the morning. I would like to be discharged in am to go home, get ready to go back to school on Monday. 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 Brandi PenSherry denies any SIHI, AVH.  Principal Problem: Severe episode of recurrent major depressive disorder, without psychotic features (HCC)  Diagnosis:   Patient Active Problem List   Diagnosis Date Noted  . Severe episode of recurrent major depressive disorder, without psychotic features (HCC) [F33.2]   . Severe major depression without psychotic features (HCC) [F32.2] 12/19/2015  . Generalized anxiety disorder [F41.1] 12/19/2015   Total Time spent with patient: 20 minutes  Past Psychiatric History: Anxiety disorder  Past Medical History:  Past Medical History  Diagnosis Date  . Panic attacks   . Panic disorder   . Depression     Past Surgical History  Procedure Laterality Date  . Gastric bypass  2003  . Abdominoplasty     Family History: History reviewed. No pertinent family history. Family Psychiatric  History: See H&P  Social History:  History  Alcohol Use  . 3.6 oz/week  . 6 Glasses of wine per week     History  Drug Use No    Social History   Social History  . Marital Status: Single    Spouse Name: N/A  . Number of Children: N/A  . Years of Education: N/A   Social History Main Topics  . Smoking  status: Current Some Day Smoker  . Smokeless tobacco: Never Used  . Alcohol Use: 3.6 oz/week    6 Glasses of wine per week  . Drug Use: No  . Sexual Activity: Yes   Other Topics Concern  . None   Social History Narrative   Additional Social History:   Sleep: Fair  Appetite:  Fair  Current Medications: Current Facility-Administered Medications  Medication Dose Route Frequency Provider Last Rate Last Dose  . acetaminophen (TYLENOL) tablet 650 mg  650 mg Oral Q4H PRN Earney NavyJosephine C Onuoha, NP      . alum & mag hydroxide-simeth (MAALOX/MYLANTA) 200-200-20 MG/5ML suspension 30 mL  30 mL Oral PRN Earney NavyJosephine C Onuoha, NP      . hydrOXYzine (ATARAX/VISTARIL) tablet 25 mg  25 mg Oral Q6H PRN Craige CottaFernando A Cobos, MD      . ibuprofen (ADVIL,MOTRIN) tablet 600 mg  600 mg Oral Q8H PRN Earney NavyJosephine C Onuoha, NP   600 mg at 12/20/15 1517  . loperamide (IMODIUM) capsule 2-4 mg  2-4 mg Oral PRN Craige CottaFernando A Cobos, MD      . LORazepam (ATIVAN) tablet 1 mg  1 mg Oral Q6H PRN Craige CottaFernando A Cobos, MD      . multivitamin with minerals tablet 1 tablet  1 tablet Oral Daily Craige CottaFernando A Cobos, MD  1 tablet at 12/21/15 0757  . ondansetron (ZOFRAN) tablet 4 mg  4 mg Oral Q8H PRN Earney Navy, NP      . ondansetron (ZOFRAN-ODT) disintegrating tablet 4 mg  4 mg Oral Q6H PRN Rockey Situ Cobos, MD      . potassium chloride (K-DUR,KLOR-CON) CR tablet 10 mEq  10 mEq Oral BID Craige Cotta, MD   10 mEq at 12/21/15 0757  . sertraline (ZOLOFT) tablet 50 mg  50 mg Oral Daily Craige Cotta, MD   50 mg at 12/21/15 0757  . thiamine (VITAMIN B-1) tablet 100 mg  100 mg Oral Daily Craige Cotta, MD   100 mg at 12/21/15 1478    Lab Results:  Results for orders placed or performed during the hospital encounter of 12/19/15 (from the past 48 hour(s))  TSH     Status: None   Collection Time: 12/21/15  6:38 AM  Result Value Ref Range   TSH 4.036 0.350 - 4.500 uIU/mL    Comment: Performed at [facility]     Physical Findings: AIMS: Facial and Oral Movements Muscles of Facial Expression: None, normal Lips and Perioral Area: None, normal Jaw: None, normal Tongue: None, normal,Extremity Movements Upper (arms, wrists, hands, fingers): None, normal Lower (legs, knees, ankles, toes): None, normal, Trunk Movements Neck, shoulders, hips: None, normal, Overall Severity Severity of abnormal movements (highest score from questions above): None, normal Incapacitation due to abnormal movements: None, normal Patient's awareness of abnormal movements (rate only patient's report): No Awareness, Dental Status Current problems with teeth and/or dentures?: No Does patient usually wear dentures?: No  CIWA:  CIWA-Ar Total: 0 COWS:  COWS Total Score: 0  Musculoskeletal: Strength & Muscle Tone: within normal limits Gait & Station: normal Patient leans: N/A  Psychiatric Specialty Exam: Review of Systems  Constitutional: Negative.   HENT: Negative.   Eyes: Negative.   Respiratory: Negative.   Cardiovascular: Negative.   Gastrointestinal: Negative.   Genitourinary: Negative.   Musculoskeletal: Negative.   Skin: Negative.   Neurological: Negative.   Endo/Heme/Allergies: Negative.   Psychiatric/Behavioral: Positive for depression ("Improving"). Negative for suicidal ideas, hallucinations, memory loss and substance abuse. The patient is nervous/anxious ("Improving"). The patient does not have insomnia.     Blood pressure 112/78, pulse 84, temperature 98.1 F (36.7 C), temperature source Oral, resp. rate 18, height  (1.651 m), weight 99.791 kg (220 lb), last menstrual period 11/21/2015, SpO2 99 %.Body mass index is 36.61 kg/(m^2).  General Appearance: Casual and Fairly Groomed  Eye Contact:: Good  Speech: Clear and Coherent and Normal Rate  Volume: Normal  Mood: Anxious and Depressed  Affect: Appropriate, Congruent and Depressed  Thought Process: Coherent, Goal Directed, Linear and  Logical  Orientation: Full (Time, Place, and Person)  Thought Content: WDL  Suicidal Thoughts: Yes. with intent/plan  Homicidal Thoughts: No  Memory: Immediate; Fair Recent; Fair Remote; Fair  Judgement: Fair  Insight: Fair  Psychomotor Activity: Normal  Concentration: Fair  Recall: [employer] of Knowledge:Fair  Language: Fair  Akathisia: No  Handed:   AIMS (if indicated):    Assets: Communication Skills Desire for Improvement Resilience Social Support  ADL's: Intact  Cognition: WNL  Sleep:            Treatment [place] 1. Continue crisis management, mood stabilization & relapse prevention.. 2. Continue current medication management to reduce current symptoms to base line and improve the  patient's overall level of functioning; Sertraline 50 mg for depression,  Lorazepam 1 mg for anxiety, Hydroxyzine 25 mg for anxiety. 3. Treat health problems as indicated. 4. Develop treatment plan to enhance medication adeherance upon discharge and the need for  readmission. 5. Psycho-social education regarding relapse prevention and self care.  Sanjuana Kava, PMHN, FNP-BC 12/21/2015, 4:01 PM    I agree with assessment and plan Madie Reno A. Dub Mikes, M.D.

## 2015-12-21 NOTE — [facility] Group Notes (Signed)
[facility] Group Notes:  (Clinical Social Work)   12/21/2015 10:00-11:00AM  Summary of Progress/Problems: In today's process group a decisional balance exercise was used to explore in depth the perceived benefits and costs of unhealthy coping techniques, as well as the benefits and costs of replacing these with healthy coping skills. Among the unhealthy coping techniques listed by the group were drinking, isolating, overtaking pain medications, directing anger inward, trying to control everything, stopping medications, denying grief, rumination, and anger outbursts. Motivational Interviewing and the whiteboard were utilized for the exercises. The patient stated her grief has not been handled, and therefore she has been isolating herself rather than deal with the grief.  She called it "facing my giants."  She was interactive with others in group, supportive of them.  She acknowledged that for herself, a routine/schedule will be an important part of her recovery plan at discharge.  Type of Therapy:  Group Therapy - Process   Participation Level:  Active  Participation Quality:  Attentive, Sharing and Supportive  Affect:  Blunted  Cognitive:  Appropriate and Oriented  Insight:  Engaged  Engagement in Therapy:  Engaged  Modes of Intervention:  Education, Motivational Interviewing  Ambrose MantleMareida Grossman-Orr, LCSW 12/21/2015, 12:33 PM

## 2015-12-22 NOTE — Progress Notes (Signed)
Patient stated [facility].  That she plans to return to her parents' home and will be seeing her son today.  Patient stated she does not wish to rescind her 72 hr request for discharge at this time and also does not want to be made involuntary by MD.  Will discuss discharge information with MD.  Charge nurse, NP and MD informed of patient's discharge.  D:  Patient's self inventory sheet, patient slept good last night, no sleep medication given.  Good appetite, high energy level, good concentration.  Rated depression, anxiety 2, denied hopeless.  Denied withdrawals.  Denied SI.  Denied physical problems.  Denied pain.  Goal is to "think positive and focus on changes and routines made/to make at home".  Plans to "go home and start my plans."  Happy New Year make 2017 a great one.  Does have discharge plans. A:  Medications administered per MD orders.  Emotional support and encouragement given patient. R:  Denied SI and HI, contracts for safety.  Denied A/V hallucinations.  Safety maintained with 15 minute checks.

## 2015-12-22 NOTE — Plan of Care (Signed)
Problem: Consults Goal: Anxiety Disorder Patient Education See Patient Education Module for eduction specifics.  Outcome: Progressing Nurse discussed anxiety/coping skills with patient.        

## 2015-12-22 NOTE — [facility] Group Notes (Signed)
[facility] Group Notes:  (Clinical Social Work)   12/22/2015    1:15-2:15PM  Summary of Progress/Problems:   The main focus of today's process group was to   1)  Discuss any resolutions or goals for the new year  2)  Identify current unhealthy supports and discuss how to set limits with them  3)  Discuss the importance of adding supports  An emphasis was placed on using counselor, doctor, therapy groups, and problem-specific support groups to expand supports.    The patient expressed full comprehension of the concepts presented, and agreed that there is a need to add more supports.  The patient stated she wants to have some positive changes in her life for 2017.  She was very engaged in the discussion and was encouraging to other patients.  She especially talked about her positive experience with therapy and encouraged others to pursue therapeutic relationships with professionals.    Type of Therapy:  Process Group with Motivational Interviewing  Participation Level:  Active  Participation Quality:  Attentive, Sharing and Supportive  Affect:  Blunted, Depressed and Tearful  Cognitive:  Appropriate and Oriented  Insight:  Engaged  Engagement in Therapy:  Engaged  Modes of Intervention:   Education, Support and Processing, Activity  Ambrose MantleMareida Grossman-Orr, LCSW 12/22/2015    3:19 PM

## 2015-12-22 NOTE — [facility] Group Notes (Signed)

## 2015-12-22 NOTE — Progress Notes (Signed)
PATIENT RESCINDED 72 HR REQUEST FOR DISCHARGE ON 12/22/2015 AT 1322.  Patient discussed discharge with NP.

## 2015-12-22 NOTE — Plan of Care (Signed)
Problem: Alteration in mood & ability to function due to Goal: LTG-Pt reports reduction in suicidal thoughts (Patient reports reduction in suicidal thoughts and is able to verbalize a safety plan for whenever patient is feeling suicidal)  Outcome: Progressing Patient currently denies suicidal ideations and is looking forward to discharging on tomorrow.

## 2015-12-22 NOTE — Progress Notes (Signed)
Patient has been up and active on the unit, attended group this evening and is looking forward to discharging on tomorrow. Patient currently denies having pain, -si/hi/a/v hall. Support and encouragement offered, safety maintained on unit with 15 min checks, will continue to monitor.

## 2015-12-22 NOTE — Progress Notes (Addendum)
Patient ID: Brandi James, female   DOB: 01-Mar-1981, 35 y.o.   MRN: [id number] [facility] MD Progress Note  12/22/2015 3:44 PM Brandi James  MRN:  [id number]  Subjective:  [name] says, "I'm doing a lot better, just a little disappointment that I could not go home today. But, I do understand the doctors others for me to stay through Monday. I know the Social Workers will need to make for me a follow-up appointment with my therapist prior to my discharge. My therapist name is Garfield Cornea. She is with [facility]. The SW should call her direct, she normally returns calls pretty fast".  Principal Problem: Severe episode of recurrent major depressive disorder, without psychotic features (HCC)  Diagnosis:   Patient Active Problem List   Diagnosis Date Noted  . Severe episode of recurrent major depressive disorder, without psychotic features (HCC) [F33.2]   . Severe major depression without psychotic features (HCC) [F32.2] 12/19/2015  . Generalized anxiety disorder [F41.1] 12/19/2015   Total Time spent with patient: 15 minutes  Past Psychiatric History: Anxiety disorder  Past Medical History:  Past Medical History  Diagnosis Date  . Panic attacks   . Panic disorder   . Depression     Past Surgical History  Procedure Laterality Date  . Gastric bypass  2003  . Abdominoplasty     Family History: History reviewed. No pertinent family history.  Family Psychiatric  History: See H&P  Social History:  History  Alcohol Use  . 3.6 oz/week  . 6 Glasses of wine per week     History  Drug Use No    Social History   Social History  . Marital Status: Single    Spouse Name: N/A  . Number of Children: N/A  . Years of Education: N/A   Social History Main Topics  . Smoking status: Current Some Day Smoker  . Smokeless tobacco: Never Used  . Alcohol Use: 3.6 oz/week    6 Glasses of wine per week  . Drug Use: No  . Sexual Activity: Yes   Other Topics Concern  . None   Social History Narrative    Additional Social History:   Sleep: Fair  Appetite:  Fair  Current Medications: Current Facility-Administered Medications  Medication Dose Route Frequency Provider Last Rate Last Dose  . acetaminophen (TYLENOL) tablet 650 mg  650 mg Oral Q4H PRN Earney Navy, NP      . alum & mag hydroxide-simeth (MAALOX/MYLANTA) 200-200-20 MG/5ML suspension 30 mL  30 mL Oral PRN Earney Navy, NP      . hydrOXYzine (ATARAX/VISTARIL) tablet 25 mg  25 mg Oral Q6H PRN Craige Cotta, MD      . ibuprofen (ADVIL,MOTRIN) tablet 600 mg  600 mg Oral Q8H PRN Earney Navy, NP   600 mg at 12/20/15 1517  . loperamide (IMODIUM) capsule 2-4 mg  2-4 mg Oral PRN Craige Cotta, MD      . LORazepam (ATIVAN) tablet 1 mg  1 mg Oral Q6H PRN Craige Cotta, MD      . multivitamin with minerals tablet 1 tablet  1 tablet Oral Daily Craige Cotta, MD   1 tablet at 12/22/15 0730  . ondansetron (ZOFRAN) tablet 4 mg  4 mg Oral Q8H PRN Earney Navy, NP      . ondansetron (ZOFRAN-ODT) disintegrating tablet 4 mg  4 mg Oral Q6H PRN Craige Cotta, MD      . sertraline (ZOLOFT) tablet 50  mg  50 mg Oral Daily Craige CottaFernando A Cobos, MD   50 mg at 12/22/15 0730  . thiamine (VITAMIN B-1) tablet 100 mg  100 mg Oral Daily Craige CottaFernando A Cobos, MD   100 mg at 12/22/15 0730    Lab Results:  Results for orders placed or performed during the hospital encounter of 12/19/15 (from the past 48 hour(s))  TSH     Status: None   Collection Time: 12/21/15  6:38 AM  Result Value Ref Range   TSH 4.036 0.350 - 4.500 uIU/mL    Comment: [facility]    Physical Findings: AIMS: Facial and Oral Movements Muscles of Facial Expression: None, normal Lips and Perioral Area: None, normal Jaw: None, normal Tongue: None, normal,Extremity Movements Upper (arms, wrists, hands, fingers): None, normal Lower (legs, knees, ankles, toes): None, normal, Trunk Movements Neck, shoulders, hips: None, normal,  Overall Severity Severity of abnormal movements (highest score from questions above): None, normal Incapacitation due to abnormal movements: None, normal Patient's awareness of abnormal movements (rate only patient's report): No Awareness, Dental Status Current problems with teeth and/or dentures?: No Does patient usually wear dentures?: No  CIWA:  CIWA-Ar Total: 1 COWS:  COWS Total Score: 1  Musculoskeletal: Strength & Muscle Tone: within normal limits Gait & Station: normal Patient leans: N/A  Psychiatric Specialty Exam: Review of Systems  Constitutional: Negative.   HENT: Negative.   Eyes: Negative.   Respiratory: Negative.   Cardiovascular: Negative.   Gastrointestinal: Negative.   Genitourinary: Negative.   Musculoskeletal: Negative.   Skin: Negative.   Neurological: Negative.   Endo/Heme/Allergies: Negative.   Psychiatric/Behavioral: Positive for depression ("Improving"). Negative for suicidal ideas, hallucinations, memory loss and substance abuse. The patient is nervous/anxious ("Improving"). The patient does not have insomnia.     Blood pressure 127/77, pulse 48, temperature 98 F (36.7 C), temperature source Oral, resp. rate 18, height 5\' 5"  (1.651 m), weight 99.791 kg (220 lb), last menstrual period 11/21/2015, SpO2 99 %.Body mass index is 36.61 kg/(m^2).  General Appearance: Casual and Fairly Groomed  Eye Contact:: Good  Speech: Clear and Coherent and Normal Rate  Volume: Normal  Mood: Anxious and Depressed  Affect: Appropriate, Congruent and Depressed  Thought Process: Coherent, Goal Directed, Linear and Logical  Orientation: Full (Time, Place, and Person)  Thought Content: WDL  Suicidal Thoughts: Yes. with intent/plan  Homicidal Thoughts: No  Memory: Immediate; Fair Recent; Fair Remote; Fair  Judgement: Fair  Insight: Fair  Psychomotor Activity: Normal  Concentration: Fair  Recall: [employer]  Fund of Knowledge:Fair   Language: Fair  Akathisia: No  Handed:   AIMS (if indicated):    Assets: Communication Skills Desire for Improvement Resilience Social Support  ADL's: Intact  Cognition: WNL  Sleep:            Treatment [place] 1. Continue crisis management, mood stabilization & relapse prevention.. 2. Continue current medication management to reduce current symptoms to base line and improve the  patient's overall level of functioning; Sertraline 50 mg for depression, Lorazepam 1 mg for anxiety, Hydroxyzine 25 mg for anxiety. 3. Treat health problems as indicated. 4. Develop treatment plan to enhance medication adeherance upon discharge and the need for  readmission. 5. Psycho-social education regarding relapse prevention and self care.  Sanjuana Kavawoko, Agnes I, PMHN, FNP-BC 12/22/2015, 3:44 PM    I agree with assessment and plan Madie Renorving A. Dub MikesLugo, M.D.

## 2015-12-23 MED ORDER — SERTRALINE HCL 50 MG PO TABS: 50.0000 mg | ORAL_TABLET | Freq: Every day | ORAL | Status: DC

## 2015-12-23 MED ORDER — ADULT MULTIVITAMIN W/MINERALS CH: 1.0000 | ORAL_TABLET | Freq: Every day | ORAL | Status: DC

## 2015-12-23 NOTE — Plan of Care (Signed)
Problem: Diagnosis: Increased Risk For Suicide Attempt Goal: LTG-Patient Will Report Improved Mood and Deny Suicidal LTG (by discharge) Patient will report improved mood and deny suicidal ideation.  Outcome: Progressing Patient currently denies suicidal ideations and is looking forward to discharge possibly on 12/23/2015

## 2015-12-23 NOTE — Progress Notes (Signed)
  [facility] :  Will you be returning to the same living situation after discharge:  Yes,  Pt returning home At discharge, do you have transportation home?: Yes,  Pt's parents to provide transportation Do you have the ability to pay for your medications: Yes,  Pt provided with prescriptions  Release of information consent forms completed and in the chart;  Patient's signature needed at discharge.  Patient to Follow up at: Follow-up Information    Follow up with [facility] Call on 12/23/2015.   Why:  Please call on Monday to schedule outpatient follow up with current therapist and psychiatrist. They require you to call and update your information prior to scheduling an apponitment.   Contact information:   [address], [address], [place], [place] [address]  Phone: [phone] Fax: [phone]      Next level [facility] care provider [facility]no  Safety Planning and Suicide Prevention discussed: Yes,  with Pt; unsuccessful contact attempts made with family  Have you used any form of tobacco in the last 30 days? (Cigarettes, Smokeless Tobacco, Cigars, and/or Pipes): No  Has patient been referred to the Quitline?: N/A patient is not a smoker  Patient has been referred for addiction treatment: N/A  Elaina HoopsCarter, Tam Savoia M 12/23/2015, 9:40 AM

## 2015-12-23 NOTE — Progress Notes (Signed)
Discharge note:  Patient received personal belongings from room and locker.  Reviewed discharge instructions, medications, prescriptions and follow up information.  Patient indicated understanding.  She denies SI/HI/AVH.  Patient left ambulatory with her father.

## 2015-12-23 NOTE — Tx Team (Signed)
Interdisciplinary Treatment Plan Update (Adult) Date: 12/20/2015   Date: 12/20/2015 11:05 AM  Progress in Treatment:  Attending groups: Yes  Participating in groups: Yes  Taking medication as prescribed: Yes  Tolerating medication: Yes  Family/Significant othe contact made: No, CSW attempting to make contact with sister Patient understands diagnosis: Yes Discussing patient identified problems/goals with staff: Yes  Medical problems stabilized or resolved: Yes  Denies suicidal/homicidal ideation: Yes Patient has not harmed self or Others: Yes   New problem(s) identified: None identified at this time.   Discharge Plan or Barriers: Pt will return home and follow-up with [facility] [place]  Additional comments:  Patient and CSW reviewed pt's identified goals and treatment plan. Patient verbalized understanding and agreed to treatment plan. CSW reviewed [facility] "Discharge Process and Patient Involvement" Form. Pt verbalized understanding of information provided and signed form.   Reason for Continuation of Hospitalization:  Anxiety Depression Medication stabilization Suicidal ideation   Estimated length of stay: 0 days; Pt stable for DC  Review of initial/current patient goals per problem list:   1.  Goal(s): Patient will participate in aftercare plan  Met:  Yes  Target date: 3-5 days from date of admission   As evidenced by: Patient will participate within aftercare plan AEB aftercare provider and housing plan at discharge being identified.   12/20/15: Pt will return home and follow-up with [facility] [place]  2.  Goal (s): Patient will exhibit decreased depressive symptoms and suicidal ideations.  Met:  Yes  Target date: 3-5 days from date of admission   As evidenced by: Patient will utilize self rating of depression at 3 or below and demonstrate decreased signs of depression or be deemed stable for discharge by MD.  12/20/15: Pt  rates depression at 0/10; denies SI. Observed to be tearful  3.  Goal(s): Patient will demonstrate decreased signs and symptoms of anxiety.  Met:  Progressing  Target date: 3-5 days from date of admission   As evidenced by: Patient will utilize self rating of anxiety at 3 or below and demonstrated decreased signs of anxiety, or be deemed stable for discharge by MD  12/20/15: Pt rates anxiety at 0/10  12/23/15: Pt rates anxiety at 3/10  Attendees:  Patient:    Family:    Physician: Dr. Parke Poisson, MD  12/20/2015 11:05 AM  Nursing: Lars Pinks, RN Case manager  12/20/2015 11:05 AM  Clinical Social Worker Peri Maris, [place] 12/20/2015 11:05 AM  Other: Erasmo Downer Drinkard LCSW 12/20/2015 11:05 AM  Clinical:  Mayra Neer, RN 12/20/2015 11:05 AM  Other: , RN Charge Nurse 12/20/2015 11:05 AM  Other:     Bo Mcclintock 12/23/2015

## 2015-12-23 NOTE — Progress Notes (Signed)
Patient has been up and active on the unit, attended group this evening and has voiced no complaints.She reports being a little upset earlier after finding out she would not be discharged today but has been ok since after talking it over with her nurse and doctor on day shift. Patient currently denies having pain, -si/hi/a/v hall. Support and encouragement offered, safety maintained on unit, will continue to monitor.
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
## 2015-12-23 NOTE — [facility] Suicide Risk Assessment (Signed)
[facility] Discharge Suicide Risk Assessment   Demographic Factors:  Caucasian  Total Time spent with patient: 30 minutes  Musculoskeletal: Strength & Muscle Tone: within normal limits Gait & Station: normal Patient leans: normal  Psychiatric Specialty Exam: Physical Exam  Review of Systems  Constitutional: Negative.   HENT: Negative.   Eyes: Negative.   Respiratory: Negative.   Cardiovascular: Negative.   Gastrointestinal: Negative.   Genitourinary: Negative.   Musculoskeletal: Negative.   Skin: Negative.   Endo/Heme/Allergies: Negative.   Psychiatric/Behavioral: Positive for depression.    Blood pressure 121/82, pulse 75, temperature 98.1 F (36.7 C), temperature source Oral, resp. rate 18, height 5\' 5"  (1.651 m), weight 99.791 kg (220 lb), last menstrual period 11/21/2015, SpO2 99 %.Body mass index is 36.61 kg/(m^2).  General Appearance: Fairly Groomed  Patent attorney::  Fair  Speech:  Clear and Coherent409  Volume:  Normal  Mood:  euthymic  Affect:  Appropriate  Thought Process:  Coherent and Goal Directed  Orientation:  Full (Time, Place, and Person)  Thought Content:  plans as she moves on  Suicidal Thoughts:  No  Homicidal Thoughts:  No  Memory:  Immediate;   Fair Recent;   Fair Remote;   Fair  Judgement:  Fair  Insight:  Present  Psychomotor Activity:  Normal  Concentration:  Fair  Recall:  [employer] of Knowledge:Fair  Language: Fair  Akathisia:  No  Handed:  Left  AIMS (if indicated):     Assets:  Desire for Improvement Housing  Sleep:  Number of Hours: 4.25  Cognition: WNL  ADL's:  Intact   Have you used any form of tobacco in the last 30 days? (Cigarettes, Smokeless Tobacco, Cigars, and/or Pipes): No  Has this patient used any form of tobacco in the last 30 days? (Cigarettes, Smokeless Tobacco, Cigars, and/or Pipes) No  Mental Status Per Nursing Assessment::   On Admission:     Current Mental Status by Physician: In full contact with reality. There  are no active SI plans or intent. She states she was disappointed whenshe was not allowed to leave yesterday but at the same time she states she got a lot out of being here the extra day. She states she feels ready to go home. She is still grieving the death of the father of her child but can see how this is a process. She states that once she took the pills she saw the picture of her son and completely got into her senses. She would never do anything like that to her child. States she is committed to continue to work on her issues   Loss Factors: Loss of significant relationship  Historical Factors: NA  Risk Reduction Factors:   Responsible for children under 85 years of age, Sense of responsibility to family, Employed, Living with another person, especially a relative and Positive social support  Continued Clinical Symptoms:  Depression:   Insomnia  Cognitive Features That Contribute To Risk:  None    Suicide Risk:  Minimal: No identifiable suicidal ideation.  Patients presenting with no risk factors but with morbid ruminations; may be classified as minimal risk based on the severity of the depressive symptoms  Principal Problem: Severe episode of recurrent major depressive disorder, without psychotic features [facility]) Discharge Diagnoses:  Patient Active Problem List   Diagnosis Date Noted  . Severe episode of recurrent major depressive disorder, without psychotic features (HCC) [F33.2]   . Severe major depression without psychotic features (HCC) [F32.2] 12/19/2015  . Generalized  anxiety disorder [F41.1] 12/19/2015    Follow-up Information    Follow up with [facility] Call on 12/23/2015.   Why:  Please call on Monday to schedule outpatient follow up with current therapist and psychiatrist. They require you to call and update your information prior to scheduling an apponitment.   Contact information:   [address], [place], [place]  [address]  Phone: [phone] Fax: [phone]      Plan Of Care/Follow-up recommendations:  Activity:  as tolerated Diet:  regular Follow up as above Is patient on multiple antipsychotic therapies at discharge:  No   Has Patient had three or more failed trials of antipsychotic monotherapy by history:  No  Recommended Plan for Multiple Antipsychotic Therapies: NA    Nataline Basara A 12/23/2015, 11:24 AM

## 2015-12-23 NOTE — [facility] Group Notes (Signed)
[facility]  12/23/2015 8:45 AM  Participation Quality: Alert, Appropriate and Oriented  Mood/Affect: Appropriate  Depression Rating: 0  Anxiety Rating: 2-3  Thoughts of Suicide: Pt denies SI/HI  Will you contract for safety? Yes  Current AVH: Pt denies  Plan for Discharge/Comments: Pt attended discharge planning group and actively participated in group. CSW discussed suicide prevention education with the group and encouraged them to discuss discharge planning and any relevant barriers. Pt reports desire to DC and get back into her daily routine. Pt reports improved mood.  Transportation Means: Pt reports access to transportation  Supports: No supports mentioned at this time  Chad CordialLauren Carter, LCSWA 12/23/2015 9:29 AM

## 2015-12-23 NOTE — Progress Notes (Signed)
Recreation Therapy Notes  Date: 01.02.2017 Time: 9:30am Location: 300 Group Room   Group Topic: Stress Management  Goal Area(s) Addresses:  Patient will actively participate in stress management techniques presented during session.   Behavioral Response: Did not attend.   Jalayah Gutridge L Muhsin Doris, LRT/CTRS        Han Lysne L 12/23/2015 10:20 AM 

## 2017-09-07 NOTE — Progress Notes (Addendum)
Subjective:    Patient ID: Brandi James, female    DOB: February 18, 1981, 36 y.o.   MRN: [id number]  HPI:  Brandi James is here to establish as a new pt.  She is a very pleasant 36 year old female.  PMH: Depression, anxiety, insomnia, and obesity. She was hospitalized Dec 2016 for unintentional overdose of ETOH and Klonopin.  She continues to struggle with depression/anxiety r/t to the death of her ex-boyfriend/father of her son "Brandi James" >2 years ago and the contentious relationship with her parents. She was previously on various anti-depressants, last one was Sertraline  daily, last used >12 months ago.  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  She drinks > 80 ounces/day and has been trying to reduce saturated fat/CHO/sugar in her diet.  She estimates drinking 3 bottles of wine/week, often a bottle in a setting.  She denies ETOH interfering with work/family responsibilities or ever driving while using ETOH. She also "social smoking" when consuming wine. She reports walking 3-5 miles/day while at work 5 days/week, however she does not exercise regularly. She was previously in therapy, however stopped >1 year ago b/c her therapist also saw her sister and she was uncomfortable with that.   One acute issue: L eye conjuctivitis   Patient Care Team    Relationship Specialty Notifications Start End  Julaine Fusi, NP PCP - General Family Medicine  09/08/17     Patient Active Problem List   Diagnosis Date Noted  . Severe episode of recurrent major depressive disorder, without psychotic features (HCC)   . Severe major depression without psychotic features (HCC) 12/19/2015  . Generalized anxiety disorder 12/19/2015     Past Medical History:  Diagnosis Date  . Depression   . Panic attacks   . Panic disorder      Past Surgical History:  Procedure Laterality Date  . abdominoplasty    . CHOLECYSTECTOMY    . GASTRIC BYPASS  2003     Family History  Problem  Relation Age of Onset  . Healthy Mother   . Diabetes Father   . Hypertension Father      History  Drug Use No     History  Alcohol Use  . 7.2 oz/week  . 12 Glasses of wine per week     History  Smoking Status  . Current Some Day Smoker  . Types: Cigarettes  Smokeless Tobacco  . Never Used     Outpatient Encounter Prescriptions as of 09/08/2017  Medication Sig  . Multiple Vitamin (MULTIVITAMIN WITH MINERALS) TABS tablet Take 1 tablet by mouth daily. For low vitamin  . gentamicin (GENTAK) 0.3 % ophthalmic ointment Place into the left eye 3 (three) times daily.  . sertraline (ZOLOFT) 50 MG tablet Take 1/2 tab for two weeks then full tab for two weeks.  . [DISCONTINUED] sertraline (ZOLOFT) 50 MG tablet Take 1 tablet (50 mg total) by mouth daily. For depression   No facility-administered encounter medications on file as of 09/08/2017.     Allergies: Morphine and related  Body mass index is 34.63 kg/m.  Blood pressure (!) 137/92, pulse 85, height  (1.651 m), weight 208 lb 1.6 oz (94.4 kg), last menstrual period 08/23/2017.       Review of Systems  Constitutional: Positive for fatigue. Negative for activity change, appetite change, chills, diaphoresis, fever and unexpected weight change.  HENT: Negative for congestion.   Eyes: Negative for visual  disturbance.  Respiratory: Negative for cough, chest tightness, shortness of breath, wheezing and stridor.   Cardiovascular: Negative for chest pain, palpitations and leg swelling.  Gastrointestinal: Negative for abdominal distention, abdominal pain, blood in stool, constipation, diarrhea, nausea and vomiting.  Endocrine: Negative for cold intolerance, heat intolerance, polydipsia, polyphagia and polyuria.  Genitourinary: Negative for difficulty urinating, dysuria, flank pain and hematuria.  Musculoskeletal: Negative for arthralgias, back pain, gait problem, joint swelling, myalgias and neck pain.  Skin: Negative for  color change, pallor, rash and wound.  Neurological: Negative for dizziness, tremors, weakness and headaches.  Hematological: Does not bruise/bleed easily.  Psychiatric/Behavioral: Positive for dysphoric mood and sleep disturbance. Negative for confusion, decreased concentration, hallucinations, self-injury and suicidal ideas. The patient is nervous/anxious. The patient is not hyperactive.        Objective:   Physical Exam  Constitutional: She is oriented to person, place, and time. She appears well-developed and well-nourished. No distress.  HENT:  Head: Normocephalic and atraumatic.  Right Ear: External ear normal.  Left Ear: External ear normal.  Eyes: Pupils are equal, round, and reactive to light. Conjunctivae are normal. Left eye exhibits discharge.  Reddened conjuctiva   Neck: Normal range of motion. Neck supple.  Cardiovascular: Normal rate, regular rhythm, normal heart sounds and intact distal pulses.   No murmur heard. Pulmonary/Chest: Effort normal and breath sounds normal. No respiratory distress. She has no wheezes. She has no rales. She exhibits no tenderness.  Lymphadenopathy:    She has no cervical adenopathy.  Neurological: She is alert and oriented to person, place, and time. Coordination normal.  Skin: Skin is warm and dry. No rash noted. No erythema. No pallor.  Psychiatric: Her speech is normal and behavior is normal. Judgment and thought content normal. Her affect is labile. Cognition and memory are normal.  She was very tearful during encounter, however did smile and laugh several times.   Nursing note and vitals reviewed.         Assessment & Plan:   1. Depression, recurrent (HCC)   2. Obesity (BMI 30.0-34.9)   3. Healthcare maintenance   4. Generalized anxiety disorder   5. Severe major depression without psychotic features [facility])     Healthcare maintenance Increase water intake, strive for at least 100 ounces/day.   Follow Heart Healthy  diet Increase regular exercise.  Recommend at least 30 minutes daily, 5 days per week of walking, jogging, biking, swimming, YouTube/Pinterest workout videos. Please take medications as directed. Referral for behavioral health placed. Please schedule follow-up in 4 weeks: to evaluate effectiveness of Sertraline and to complete physical with fasting labs.  Generalized anxiety disorder Re-started on Sertraline  1/2 tab for first two week then full tab for two weeks. Reduce ETOH use. CBT referral placed.   Severe major depression without psychotic features (HCC) Re-started on Sertraline  1/2 tab for first two week then full tab for two weeks. Reduce ETOH use. CBT referral placed.   Obesity (BMI 30.0-34.9) Increase water intake, strive for at least 100 ounces/day.   Follow Heart Healthy diet Increase regular exercise.  Recommend at least 30 minutes daily, 5 days per week of walking, jogging, biking, swimming, YouTube/Pinterest workout videos. Please take medications as directed.    FOLLOW-UP:  Return in about 4 weeks (around 10/06/2017) for Evaluate Medication Effectiveness, CPE, Fasting Lab Draw.

## 2017-09-08 ENCOUNTER — Ambulatory Visit (INDEPENDENT_AMBULATORY_CARE_PROVIDER_SITE_OTHER): Payer: BC Managed Care – PPO | Admitting: Adult Health

## 2017-09-08 ENCOUNTER — Encounter: Payer: Self-pay | Admitting: Adult Health

## 2017-09-08 VITALS — BP 137/92 | HR 85 | Ht 65.0 in | Wt 208.1 lb

## 2017-09-08 DIAGNOSIS — F339 Major depressive disorder, recurrent, unspecified: Secondary | ICD-10-CM

## 2017-09-08 DIAGNOSIS — E66811 Obesity, class 1: Secondary | ICD-10-CM

## 2017-09-08 DIAGNOSIS — F411 Generalized anxiety disorder: Secondary | ICD-10-CM | POA: Diagnosis not present

## 2017-09-08 DIAGNOSIS — Z0000 Encounter for general adult medical examination without abnormal findings: Secondary | ICD-10-CM | POA: Diagnosis not present

## 2017-09-08 DIAGNOSIS — F322 Major depressive disorder, single episode, severe without psychotic features: Secondary | ICD-10-CM

## 2017-09-08 DIAGNOSIS — E669 Obesity, unspecified: Secondary | ICD-10-CM | POA: Diagnosis not present

## 2017-09-08 MED ORDER — GENTAMICIN SULFATE 0.3 % OP OINT: TOPICAL_OINTMENT | Freq: Three times a day (TID) | OPHTHALMIC | 0 refills | Status: DC

## 2017-09-08 MED ORDER — SERTRALINE HCL 50 MG PO TABS: ORAL_TABLET | ORAL | 3 refills | Status: DC

## 2017-09-08 NOTE — Assessment & Plan Note (Signed)
Increase water intake, strive for at least 100 ounces/day.   Follow Heart Healthy diet Increase regular exercise.  Recommend at least 30 minutes daily, 5 days per week of walking, jogging, biking, swimming, YouTube/Pinterest workout videos. Please take medications as directed. Referral for behavioral health placed. Please schedule follow-up in 4 weeks: to evaluate effectiveness of Sertraline and to complete physical with fasting labs.

## 2017-09-08 NOTE — Assessment & Plan Note (Signed)
Re-started on Sertraline 50mg 1/2 tab for first two week then full tab for two weeks. Reduce ETOH use. CBT referral placed.  

## 2017-09-08 NOTE — Patient Instructions (Addendum)
Heart-Healthy Eating Plan Many factors influence your heart health, including eating and exercise habits. Heart (coronary) risk increases with abnormal blood fat (lipid) levels. Heart-healthy meal planning includes limiting unhealthy fats, increasing healthy fats, and making other small dietary changes. This includes maintaining a healthy body weight to help keep lipid levels within a normal range. What is my plan? Your health care provider recommends that you:  Get no more than __25__% of the total calories in your daily diet from fat.  Limit your intake of saturated fat to less than __5__% of your total calories each day.  Limit the amount of cholesterol in your diet to less than _300__ mg per day.  What types of fat should I choose?  Choose healthy fats more often. Choose monounsaturated and polyunsaturated fats, such as olive oil and canola oil, flaxseeds, walnuts, almonds, and seeds.  Eat more omega-3 fats. Good choices include salmon, mackerel, sardines, tuna, flaxseed oil, and ground flaxseeds. Aim to eat fish at least two times each week.  Limit saturated fats. Saturated fats are primarily found in animal products, such as meats, butter, and cream. Plant sources of saturated fats include palm oil, palm kernel oil, and coconut oil.  Avoid foods with partially hydrogenated oils in them. These contain trans fats. Examples of foods that contain trans fats are stick margarine, some tub margarines, cookies, crackers, and other baked goods. What general guidelines do I need to follow?  Check food labels carefully to identify foods with trans fats or high amounts of saturated fat.  Fill one half of your plate with vegetables and green salads. Eat 4-5 servings of vegetables per day. A serving of vegetables equals 1 cup of raw leafy vegetables,  cup of raw or cooked cut-up vegetables, or  cup of vegetable juice.  Fill one fourth of your plate with whole grains. Look for the word "whole" as  the first word in the ingredient list.  Fill one fourth of your plate with lean protein foods.  Eat 4-5 servings of fruit per day. A serving of fruit equals one medium whole fruit,  cup of dried fruit,  cup of fresh, frozen, or canned fruit, or  cup of 100% fruit juice.  Eat more foods that contain soluble fiber. Examples of foods that contain this type of fiber are apples, broccoli, carrots, beans, peas, and barley. Aim to get 20-30 g of fiber per day.  Eat more home-cooked food and less restaurant, buffet, and fast food.  Limit or avoid alcohol.  Limit foods that are high in starch and sugar.  Avoid fried foods.  Cook foods by using methods other than frying. Baking, boiling, grilling, and broiling are all great options. Other fat-reducing suggestions include: ? Removing the skin from poultry. ? Removing all visible fats from meats. ? Skimming the fat off of stews, soups, and gravies before serving them. ? Steaming vegetables in water or broth.  Lose weight if you are overweight. Losing just 5-10% of your initial body weight can help your overall health and prevent diseases such as diabetes and heart disease.  Increase your consumption of nuts, legumes, and seeds to 4-5 servings per week. One serving of dried beans or legumes equals  cup after being cooked, one serving of nuts equals 1 ounces, and one serving of seeds equals  ounce or 1 tablespoon.  You may need to monitor your salt (sodium) intake, especially if you have high blood pressure. Talk with your health care provider or dietitian to get  more information about reducing sodium. What foods can I eat? Grains  Breads, including [place], white, pita, wheat, raisin, rye, oatmeal, and [place]. Tortillas that are neither fried nor made with lard or trans fat. Low-fat rolls, including hotdog and hamburger buns and English muffins. Biscuits. Muffins. Waffles. Pancakes. Light popcorn. Whole-grain cereals. Flatbread. Melba toast.  Pretzels. Breadsticks. Rusks. Low-fat snacks and crackers, including oyster, saltine, matzo, graham, animal, and rye. Rice and pasta, including brown rice and those that are made with whole wheat. Vegetables All vegetables. Fruits All fruits, but limit coconut. Meats and Other Protein Sources Lean, well-trimmed beef, veal, pork, and lamb. Chicken and [place] without skin. All fish and shellfish. Wild duck, rabbit, pheasant, and venison. Egg whites or low-cholesterol egg substitutes. Dried beans, peas, lentils, and tofu.Seeds and most nuts. Dairy Low-fat or nonfat cheeses, including ricotta, string, and mozzarella. Skim or 1% milk that is liquid, powdered, or evaporated. Buttermilk that is made with low-fat milk. Nonfat or low-fat yogurt. Beverages Mineral water. Diet carbonated beverages. Sweets and Desserts Sherbets and fruit ices. Honey, jam, marmalade, jelly, and syrups. Meringues and gelatins. Pure sugar candy, such as hard candy, jelly beans, gumdrops, mints, marshmallows, and small amounts of dark chocolate. [employer] Eat all sweets and desserts in moderation. Fats and Oils Nonhydrogenated (trans-free) margarines. Vegetable oils, including soybean, sesame, sunflower, olive, peanut, safflower, corn, canola, and cottonseed. Salad dressings or mayonnaise that are made with a vegetable oil. Limit added fats and oils that you use for cooking, baking, salads, and as spreads. Other Cocoa powder. Coffee and tea. All seasonings and condiments. The items listed above may not be a complete list of recommended foods or beverages. Contact your dietitian for more options. What foods are not recommended? Grains Breads that are made with saturated or trans fats, oils, or whole milk. Croissants. Butter rolls. Cheese breads. Sweet rolls. Donuts. Buttered popcorn. Chow mein noodles. High-fat crackers, such as cheese or butter crackers. Meats and Other Protein Sources Fatty meats, such as hotdogs,  short ribs, sausage, spareribs, bacon, ribeye roast or steak, and mutton. High-fat deli meats, such as salami and bologna. Caviar. Domestic duck and goose. Organ meats, such as kidney, liver, sweetbreads, brains, gizzard, chitterlings, and heart. Dairy Cream, sour cream, cream cheese, and creamed cottage cheese. Whole milk cheeses, including blue (bleu), Monterey Jack, [place], [place], American, [place], Swiss, [place], [place], and [place]. Whole or 2% milk that is liquid, evaporated, or condensed. Whole buttermilk. Cream sauce or high-fat cheese sauce. Yogurt that is made from whole milk. Beverages Regular sodas and drinks with added sugar. Sweets and Desserts Frosting. Pudding. Cookies. Cakes other than angel food cake. Candy that has milk chocolate or white chocolate, hydrogenated fat, butter, coconut, or unknown ingredients. Buttered syrups. Full-fat ice cream or ice cream drinks. Fats and Oils Gravy that has suet, meat fat, or shortening. Cocoa butter, hydrogenated oils, palm oil, coconut oil, palm kernel oil. These can often be found in baked products, candy, fried foods, nondairy creamers, and whipped toppings. Solid fats and shortenings, including bacon fat, salt pork, lard, and butter. Nondairy cream substitutes, such as coffee creamers and sour cream substitutes. Salad dressings that are made of unknown oils, cheese, or sour cream. The items listed above may not be a complete list of foods and beverages to avoid. Contact your dietitian for more information. This information is not intended to replace advice given to you by your health care provider. Make sure you discuss any questions you have with your health care  provider. Document Released: 09/15/2008 Document Revised: 06/26/2016 Document Reviewed: 05/31/2014 Elsevier Interactive Patient Education  2017 Elsevier Inc.   Increase water intake, strive for at least 100 ounces/day.   Follow Heart Healthy diet Increase regular exercise.   Recommend at least 30 minutes daily, 5 days per week of walking, jogging, biking, swimming, YouTube/Pinterest workout videos. Please take medications as directed. Referral for behavioral health placed. Please schedule follow-up in 4 weeks: to evaluate effectiveness of Sertraline and to complete physical with fasting labs.

## 2017-09-08 NOTE — Assessment & Plan Note (Signed)
Re-started on Sertraline  1/2 tab for first two week then full tab for two weeks. Reduce ETOH use. CBT referral placed.

## 2017-09-08 NOTE — Assessment & Plan Note (Signed)
Increase water intake, strive for at least 100 ounces/day.   Follow Heart Healthy diet Increase regular exercise.  Recommend at least 30 minutes daily, 5 days per week of walking, jogging, biking, swimming, YouTube/Pinterest workout videos. Please take medications as directed.

## 2017-09-09 ENCOUNTER — Ambulatory Visit: Payer: Self-pay | Admitting: Adult Health

## 2017-10-07 ENCOUNTER — Ambulatory Visit: Payer: Self-pay | Admitting: Adult Health

## 2019-01-19 ENCOUNTER — Telehealth: Payer: Self-pay

## 2019-01-19 NOTE — Telephone Encounter (Signed)
Pt states that her BP was 150/110 at [facility].  Pt states that she only sat for a couple of minutes prior to checking BP.  Pt scheduled OV for 01/23/2019 with William Hamburger, NP.  Pt is requesting advice for what she should do over the weekend.  Advised pt to use her father's BP machine to check BP over the weekend after sitting for 15 minutes, stay well hydrated and limit salt intake.  Also advised pt that if she develops chest pain, SOB, dizziness, mental confusion, etc prior to her appt then she needs to proceed ED.  Pt expressed understanding and is agreeable.  Tiajuana Amass, CMA

## 2019-01-23 ENCOUNTER — Ambulatory Visit: Payer: 59 | Admitting: Adult Health

## 2019-01-23 ENCOUNTER — Encounter: Payer: Self-pay | Admitting: Adult Health

## 2019-01-23 VITALS — BP 143/97 | HR 83 | Temp 99.5°F | Ht 65.0 in | Wt 197.4 lb

## 2019-01-23 DIAGNOSIS — F411 Generalized anxiety disorder: Secondary | ICD-10-CM | POA: Diagnosis not present

## 2019-01-23 DIAGNOSIS — I10 Essential (primary) hypertension: Secondary | ICD-10-CM | POA: Diagnosis not present

## 2019-01-23 DIAGNOSIS — Z0000 Encounter for general adult medical examination without abnormal findings: Secondary | ICD-10-CM

## 2019-01-23 MED ORDER — PROPRANOLOL HCL 10 MG PO TABS: 10.0000 mg | ORAL_TABLET | Freq: Two times a day (BID) | ORAL | 0 refills | Status: DC

## 2019-01-23 NOTE — Assessment & Plan Note (Signed)
Declined Behavioral Health referral due to lack of insurance coverage Increase regular exercise Started on propranolol 10mg  BID

## 2019-01-23 NOTE — Assessment & Plan Note (Signed)
Several BP readings and clinic reading above goal Denies hx of asthma Due to anxiety, choose to start on propranolol 10mg  BID DASH diet, increase regular exercise Non-fasting labs obtained today Record BP, HR- f/u two weeks, bring log

## 2019-01-23 NOTE — Progress Notes (Signed)
Subjective:    Patient ID: Brandi James, female    DOB: 1981/01/10, 38 y.o.   MRN: [id number]  HPI:  Brandi James presents with elevated BP and sig anxiety. She has hx of GAD, was started on sertraline Sept 2018, however never followed-up so rx was not refilled. 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 She has few friends and her family "has a lot of drama" so her support system is limited. She smokes 3-4 cigarettes/day She drinks 1-2 glasses wine/night She denies any regular exercise She reports a diet high in processed foods She denies thoughts of harming herself/others  She reports taking her BP at various [employer] and at home - readings: SBP 110-140 SBP 80-100 HR 80-100 She denies CP/dyspnea/dizziness/HA/palpitations  She denies first degree family hx of MI/CVA  She declined referral to [facility]  Patient Care Team    Relationship Specialty Notifications Start End  Julaine Fusi, NP PCP - General Family Medicine  09/08/17     Patient Active Problem List   Diagnosis Date Noted  . HTN, goal below 130/80 01/23/2019  . Obesity (BMI 30.0-34.9) 09/08/2017  . Healthcare maintenance 09/08/2017  . Severe episode of recurrent major depressive disorder, without psychotic features (HCC)   . Severe major depression without psychotic features (HCC) 12/19/2015  . Generalized anxiety disorder 12/19/2015     Past Medical History:  Diagnosis Date  . Depression   . Panic attacks   . Panic disorder      Past Surgical History:  Procedure Laterality Date  . abdominoplasty    . CHOLECYSTECTOMY    . GASTRIC BYPASS  2003     Family History  Problem Relation Age of Onset  . Healthy Mother   . Diabetes Father   . Hypertension Father      Social History   Substance and  Sexual Activity  Drug Use No     Social History   Substance and Sexual Activity  Alcohol Use Yes  . Alcohol/week: 12.0 standard drinks  . Types: 12 Glasses of wine per week     Social History   Tobacco Use  Smoking Status Current Some Day Smoker  . Types: Cigarettes  Smokeless Tobacco Never Used     Outpatient Encounter Medications as of 01/23/2019  Medication Sig  . propranolol (INDERAL) 10 MG tablet Take 1 tablet (10 mg total) by mouth 2 (two) times daily.  . [DISCONTINUED] gentamicin (GENTAK) 0.3 % ophthalmic ointment Place into the left eye 3 (three) times daily.  . [DISCONTINUED] Multiple Vitamin (MULTIVITAMIN WITH MINERALS) TABS tablet Take 1 tablet by mouth daily. For low vitamin  . [DISCONTINUED] sertraline (ZOLOFT) 50 MG tablet Take 1/2 tab for two weeks then full tab for two weeks.   No facility-administered encounter medications on file as of 01/23/2019.     Allergies: Morphine and related  Body mass index is 32.85 kg/m.  Blood pressure (!) 143/97, pulse 83, temperature 99.5 F (37.5 C), temperature source Oral, height 5\' 5"  (1.651 m), weight 197 lb 6.4 oz (89.5 kg), last menstrual period 01/09/2019, SpO2 96 %. Review of Systems  Constitutional: Positive for fatigue. Negative for activity change, appetite change, chills, diaphoresis, fever and unexpected weight change.  Eyes: Negative for visual disturbance.  Respiratory: Negative for cough,  chest tightness, shortness of breath, wheezing and stridor.   Cardiovascular: Negative for chest pain, palpitations and leg swelling.  Endocrine: Negative for cold intolerance, heat intolerance, polydipsia, polyphagia and polyuria.  Neurological: Negative for dizziness and headaches.  Hematological: Does not bruise/bleed easily.  Psychiatric/Behavioral: Positive for sleep disturbance. The patient is nervous/anxious.        Objective:   Physical Exam Constitutional:      General: She is not in acute distress.     Appearance: She is obese. She is not ill-appearing, toxic-appearing or diaphoretic.  HENT:     Head: Normocephalic and atraumatic.  Cardiovascular:     Rate and Rhythm: Normal rate.     Pulses: Normal pulses.     Heart sounds: Normal heart sounds. No murmur. No friction rub. No gallop.   Pulmonary:     Effort: Pulmonary effort is normal. No respiratory distress.     Breath sounds: Normal breath sounds. No stridor. No wheezing, rhonchi or rales.  Chest:     Chest wall: No tenderness.  Skin:    Capillary Refill: Capillary refill takes less than 2 seconds.  Neurological:     Mental Status: She is alert and oriented to person, place, and time.  Psychiatric:        Attention and Perception: Attention and perception normal.        Mood and Affect: Mood is anxious. Affect is tearful.        Speech: Speech normal.        Behavior: Behavior normal.        Thought Content: Thought content normal.        Cognition and Memory: Cognition and memory normal.        Judgment: Judgment normal.     Comments: Well groomed Tearful when speaking about her son and the loss of her son's father       Assessment & Plan:   1. Healthcare maintenance   2. HTN, goal below 130/80   3. Generalized anxiety disorder     Healthcare maintenance Increase water intake, strive for at least 100 ounces/day.   Follow DASH diet Increase regular exercise.  Recommend at least 30 minutes daily, 5 days per week of walking, jogging, biking, swimming, YouTube/Pinterest workout videos. Please start Propranolol 10mg , twice daily. Please check you heart rate and blood pressure daily- record on log. We will call you when lab results are available. Please follow-up in 2 weeks.  HTN, goal below 130/80 Several BP readings and clinic reading above goal Denies hx of asthma Due to anxiety, choose to start on propranolol 10mg  BID DASH diet, increase regular exercise Non-fasting labs obtained today Record BP, HR- f/u two  weeks, bring log  Generalized anxiety disorder Declined Behavioral Health referral due to lack of insurance coverage Increase regular exercise Started on propranolol 10mg  BID    FOLLOW-UP:  Return in about 2 weeks (around 02/06/2019) for HTN, Obesity.

## 2019-01-23 NOTE — Assessment & Plan Note (Signed)
Increase water intake, strive for at least 100 ounces/day.   Follow DASH diet Increase regular exercise.  Recommend at least 30 minutes daily, 5 days per week of walking, jogging, biking, swimming, YouTube/Pinterest workout videos. Please start Propranolol 10mg , twice daily. Please check you heart rate and blood pressure daily- record on log. We will call you when lab results are available. Please follow-up in 2 weeks.

## 2019-01-23 NOTE — Patient Instructions (Addendum)
DASH Eating Plan DASH stands for "Dietary Approaches to Stop Hypertension." The DASH eating plan is a healthy eating plan that has been shown to reduce high blood pressure (hypertension). It may also reduce your risk for type 2 diabetes, heart disease, and stroke. The DASH eating plan may also help with weight loss. What are tips for following this plan?  General guidelines  Avoid eating more than 2,300 mg (milligrams) of salt (sodium) a day. If you have hypertension, you may need to reduce your sodium intake to 1,500 mg a day.  Limit alcohol intake to no more than 1 drink a day for nonpregnant women and 2 drinks a day for men. One drink equals 12 oz of beer, 5 oz of wine, or 1 oz of hard liquor.  Work with your health care provider to maintain a healthy body weight or to lose weight. Ask what an ideal weight is for you.  Get at least 30 minutes of exercise that causes your heart to beat faster (aerobic exercise) most days of the week. Activities may include walking, swimming, or biking.  Work with your health care provider or diet and nutrition specialist (dietitian) to adjust your eating plan to your individual calorie needs. Reading food labels   Check food labels for the amount of sodium per serving. Choose foods with less than 5 percent of the Daily Value of sodium. Generally, foods with less than 300 mg of sodium per serving fit into this eating plan.  To find whole grains, look for the word "whole" as the first word in the ingredient list. Shopping  Buy products labeled as "low-sodium" or "no salt added."  Buy fresh foods. Avoid canned foods and premade or frozen meals. Cooking  Avoid adding salt when cooking. Use salt-free seasonings or herbs instead of table salt or sea salt. Check with your health care provider or pharmacist before using salt substitutes.  Do not fry foods. Cook foods using healthy methods such as baking, boiling, grilling, and broiling instead.  Cook with  heart-healthy oils, such as olive, canola, soybean, or sunflower oil. Meal planning  Eat a balanced diet that includes: ? 5 or more servings of fruits and vegetables each day. At each meal, try to fill half of your plate with fruits and vegetables. ? Up to 6-8 servings of whole grains each day. ? Less than 6 oz of lean meat, poultry, or fish each day. A 3-oz serving of meat is about the same size as a deck of cards. One egg equals 1 oz. ? 2 servings of low-fat dairy each day. ? A serving of nuts, seeds, or beans 5 times each week. ? Heart-healthy fats. Healthy fats called Omega-3 fatty acids are found in foods such as flaxseeds and coldwater fish, like sardines, salmon, and mackerel.  Limit how much you eat of the following: ? Canned or prepackaged foods. ? Food that is high in trans fat, such as fried foods. ? Food that is high in saturated fat, such as fatty meat. ? Sweets, desserts, sugary drinks, and other foods with added sugar. ? Full-fat dairy products.  Do not salt foods before eating.  Try to eat at least 2 vegetarian meals each week.  Eat more home-cooked food and less restaurant, buffet, and fast food.  When eating at a restaurant, ask that your food be prepared with less salt or no salt, if possible. What foods are recommended? The items listed may not be a complete list. Talk with your dietitian about   what dietary choices are best for you. Grains Whole-grain or whole-wheat bread. Whole-grain or whole-wheat pasta. Brown rice. Oatmeal. Quinoa. Bulgur. Whole-grain and low-sodium cereals. Pita bread. Low-fat, low-sodium crackers. Whole-wheat flour tortillas. Vegetables Fresh or frozen vegetables (raw, steamed, roasted, or grilled). Low-sodium or reduced-sodium tomato and vegetable juice. Low-sodium or reduced-sodium tomato sauce and tomato paste. Low-sodium or reduced-sodium canned vegetables. Fruits All fresh, dried, or frozen fruit. Canned fruit in natural juice (without  added sugar). Meat and other protein foods Skinless chicken or turkey. Ground chicken or turkey. Pork with fat trimmed off. Fish and seafood. Egg whites. Dried beans, peas, or lentils. Unsalted nuts, nut butters, and seeds. Unsalted canned beans. Lean cuts of beef with fat trimmed off. Low-sodium, lean deli meat. Dairy Low-fat (1%) or fat-free (skim) milk. Fat-free, low-fat, or reduced-fat cheeses. Nonfat, low-sodium ricotta or cottage cheese. Low-fat or nonfat yogurt. Low-fat, low-sodium cheese. Fats and oils Soft margarine without trans fats. Vegetable oil. Low-fat, reduced-fat, or light mayonnaise and salad dressings (reduced-sodium). Canola, safflower, olive, soybean, and sunflower oils. Avocado. Seasoning and other foods Herbs. Spices. Seasoning mixes without salt. Unsalted popcorn and pretzels. Fat-free sweets. What foods are not recommended? The items listed may not be a complete list. Talk with your dietitian about what dietary choices are best for you. Grains Baked goods made with fat, such as croissants, muffins, or some breads. Dry pasta or rice meal packs. Vegetables Creamed or fried vegetables. Vegetables in a cheese sauce. Regular canned vegetables (not low-sodium or reduced-sodium). Regular canned tomato sauce and paste (not low-sodium or reduced-sodium). Regular tomato and vegetable juice (not low-sodium or reduced-sodium). Pickles. Olives. Fruits Canned fruit in a light or heavy syrup. Fried fruit. Fruit in cream or butter sauce. Meat and other protein foods Fatty cuts of meat. Ribs. Fried meat. Bacon. Sausage. Bologna and other processed lunch meats. Salami. Fatback. Hotdogs. Bratwurst. Salted nuts and seeds. Canned beans with added salt. Canned or smoked fish. Whole eggs or egg yolks. Chicken or turkey with skin. Dairy Whole or 2% milk, cream, and half-and-half. Whole or full-fat cream cheese. Whole-fat or sweetened yogurt. Full-fat cheese. Nondairy creamers. Whipped toppings.  Processed cheese and cheese spreads. Fats and oils Butter. Stick margarine. Lard. Shortening. Ghee. Bacon fat. Tropical oils, such as coconut, palm kernel, or palm oil. Seasoning and other foods Salted popcorn and pretzels. Onion salt, garlic salt, seasoned salt, table salt, and sea salt. Worcestershire sauce. Tartar sauce. Barbecue sauce. Teriyaki sauce. Soy sauce, including reduced-sodium. Steak sauce. Canned and packaged gravies. Fish sauce. Oyster sauce. Cocktail sauce. Horseradish that you find on the shelf. Ketchup. Mustard. Meat flavorings and tenderizers. Bouillon cubes. Hot sauce and Tabasco sauce. Premade or packaged marinades. Premade or packaged taco seasonings. Relishes. Regular salad dressings. Where to find more information:  National Heart, Lung, and Blood Institute: www.nhlbi.nih.gov  American Heart Association: www.heart.org Summary  The DASH eating plan is a healthy eating plan that has been shown to reduce high blood pressure (hypertension). It may also reduce your risk for type 2 diabetes, heart disease, and stroke.  With the DASH eating plan, you should limit salt (sodium) intake to 2,300 mg a day. If you have hypertension, you may need to reduce your sodium intake to 1,500 mg a day.  When on the DASH eating plan, aim to eat more fresh fruits and vegetables, whole grains, lean proteins, low-fat dairy, and heart-healthy fats.  Work with your health care provider or diet and nutrition specialist (dietitian) to adjust your eating plan to your   individual calorie needs. This information is not intended to replace advice given to you by your health care provider. Make sure you discuss any questions you have with your health care provider. Document Released: 11/26/2011 Document Revised: 11/30/2016 Document Reviewed: 11/30/2016 Elsevier Interactive Patient Education  2019 [employer]   Increase water intake, strive for at least 100 ounces/day.   Follow DASH diet Increase  regular exercise.  Recommend at least 30 minutes daily, 5 days per week of walking, jogging, biking, swimming, YouTube/Pinterest workout videos. Please start Propranolol 10mg , twice daily. Please check you heart rate and blood pressure daily- record on log. We will call you when lab results are available. Please follow-up in 2 weeks. HAPPY EARLY 38th BIRTHDAY!

## 2019-01-24 DIAGNOSIS — R7989 Other specified abnormal findings of blood chemistry: Secondary | ICD-10-CM | POA: Insufficient documentation

## 2019-01-24 LAB — CBC WITH DIFFERENTIAL/PLATELET
BASOS ABS: 0.1 10*3/uL (ref 0.0–0.2)
BASOS: 1 %
EOS (ABSOLUTE): 0.1 10*3/uL (ref 0.0–0.4)
EOS: 1 %
HEMATOCRIT: 41 % (ref 34.0–46.6)
HEMOGLOBIN: 14.5 g/dL (ref 11.1–15.9)
IMMATURE GRANS (ABS): 0 10*3/uL (ref 0.0–0.1)
Immature Granulocytes: 0 %
LYMPHS ABS: 1.8 10*3/uL (ref 0.7–3.1)
LYMPHS: 20 %
MCH: 36 pg — AB (ref 26.6–33.0)
MCHC: 35.4 g/dL (ref 31.5–35.7)
MCV: 102 fL — ABNORMAL HIGH (ref 79–97)
MONOCYTES: 11 %
Monocytes Absolute: 1 10*3/uL — ABNORMAL HIGH (ref 0.1–0.9)
NEUTROS ABS: 6.1 10*3/uL (ref 1.4–7.0)
Neutrophils: 67 %
Platelets: 203 10*3/uL (ref 150–450)
RBC: 4.03 x10E6/uL (ref 3.77–5.28)
RDW: 12.2 % (ref 11.7–15.4)
WBC: 9 10*3/uL (ref 3.4–10.8)

## 2019-01-24 LAB — COMPREHENSIVE METABOLIC PANEL
ALBUMIN: 4.4 g/dL (ref 3.8–4.8)
ALK PHOS: 137 IU/L — AB (ref 39–117)
ALT: 97 IU/L — ABNORMAL HIGH (ref 0–32)
AST: 146 IU/L — AB (ref 0–40)
Albumin/Globulin Ratio: 1.4 (ref 1.2–2.2)
BUN / CREAT RATIO: 8 — AB (ref 9–23)
BUN: 5 mg/dL — AB (ref 6–20)
Bilirubin Total: 0.7 mg/dL (ref 0.0–1.2)
CALCIUM: 10.1 mg/dL (ref 8.7–10.2)
CO2: 22 mmol/L (ref 20–29)
CREATININE: 0.64 mg/dL (ref 0.57–1.00)
Chloride: 94 mmol/L — ABNORMAL LOW (ref 96–106)
GFR calc Af Amer: 130 mL/min/{1.73_m2} (ref >59)
GFR, EST NON AFRICAN AMERICAN: 113 mL/min/{1.73_m2} (ref >59)
GLUCOSE: 96 mg/dL (ref 65–99)
Globulin, Total: 3.1 g/dL (ref 1.5–4.5)
Potassium: 4.7 mmol/L (ref 3.5–5.2)
Sodium: 137 mmol/L (ref 134–144)
Total Protein: 7.5 g/dL (ref 6.0–8.5)

## 2019-01-24 LAB — TSH: TSH: 5.45 u[IU]/mL — ABNORMAL HIGH (ref 0.450–4.500)

## 2019-01-24 LAB — HEMOGLOBIN A1C
Est. average glucose Bld gHb Est-mCnc: 103 mg/dL
HEMOGLOBIN A1C: 5.2 % (ref 4.8–5.6)

## 2019-01-27 LAB — T3: T3, Total: 104 ng/dL (ref 71–180)

## 2019-01-27 LAB — T4, FREE: Free T4: 1.24 ng/dL (ref 0.82–1.77)

## 2019-01-27 LAB — SPECIMEN STATUS REPORT

## 2019-01-31 NOTE — Progress Notes (Signed)
Subjective:    Patient ID: Brandi James, female    DOB: [birth year], 38 y.o.   MRN: [id number]  HPI: 01/23/2019 OV:  Brandi James presents with elevated BP and sig anxiety. She has hx of GAD, was started on sertraline Sept 2018, however never followed-up so rx was not refilled. She reports sources of stress/anxiety- Single parent to 15 year old son that has ADD Son's father passed away 4 years ago from MI She is happy with her new job, however her work is stressful - she is [employer] manager for [employer]. She has few friends and her family "has a lot of drama" so her support system is limited. She smokes 3-4 cigarettes/day She drinks 1-2 glasses wine/night She denies any regular exercise She reports a diet high in processed foods She denies thoughts of harming herself/others  She reports taking her BP at various [employer] and at home - readings: SBP 110-140 SBP 80-100 HR 80-100 She denies CP/dyspnea/dizziness/HA/palpitations  She denies first degree family hx of MI/CVA  She declined referral to [facility]  02/06/2019 OV: Ms. Douse is here for 2 week f/u: HTN,  Declined Behavioral Health referral due to lack of insurance coverage Increase regular exercise Started on propranolol 10mg  BID She reports home readings- SBP 110-120, mean low 120 DBP 80-90, mean 80 HR 70-90, mean 80 She denies CP/dyspnea/dizziness/HA/palpitations She reports sig reduction in overall anxiety She has reduced frozen food intake and "been cooking more home meals" She has been playing basketball with her son "[name]" and hopes to continue increase cardiovascular exercise She reports drinking bottle wine/night- Reviewed recent labs-  CBC- macrocytic anemia -she is taking oral iron consistently CMP- LFTs elevated- drinking bottle wine/night- advised to drink 0-1 glass nigh/max, avoid Acetaminophen, and increase regular exercise She denies abdominal pain She denies N/V/D, fever/malaise/change in  appetite  Hx of elevated LFTs in past  Patient Care Team    Relationship Specialty Notifications Start End  Julaine Fusi, NP PCP - General Family Medicine  09/08/17     Patient Active Problem List   Diagnosis Date Noted  . Elevated TSH 01/24/2019  . HTN, goal below 130/80 01/23/2019  . Obesity (BMI 30.0-34.9) 09/08/2017  . Healthcare maintenance 09/08/2017  . Severe episode of recurrent major depressive disorder, without psychotic features (HCC)   . Severe major depression without psychotic features (HCC) 12/19/2015  . Generalized anxiety disorder 12/19/2015     Past Medical History:  Diagnosis Date  . Depression   . Panic attacks   . Panic disorder      Past Surgical History:  Procedure Laterality Date  . abdominoplasty    . CHOLECYSTECTOMY    . GASTRIC BYPASS  2003     Family History  Problem Relation Age of Onset  . Healthy Mother   . Diabetes Father   . Hypertension Father      Social History   Substance and Sexual Activity  Drug Use No     Social History   Substance and Sexual Activity  Alcohol Use Yes  . Alcohol/week: 12.0 standard drinks  . Types: 12 Glasses of wine per week     Social History   Tobacco Use  Smoking Status Current Some Day Smoker  . Types: Cigarettes  Smokeless Tobacco Never Used     Outpatient Encounter Medications as of 02/06/2019  Medication Sig  . propranolol (INDERAL) 10 MG tablet Take 1 tablet (10 mg total) by mouth 2 (two) times daily.   No  facility-administered encounter medications on file as of 02/06/2019.     Allergies: Morphine and related  Body mass index is 32.2 kg/m.  Blood pressure 132/88, pulse 70, temperature 98.7 F (37.1 C), temperature source Oral, height 5\' 5"  (1.651 m), weight 193 lb 8 oz (87.8 kg), last menstrual period 01/09/2019, SpO2 97 %. Review of Systems  Constitutional: Positive for fatigue. Negative for activity change, appetite change, chills, diaphoresis, fever and  unexpected weight change.  Eyes: Negative for visual disturbance.  Respiratory: Negative for cough, chest tightness, shortness of breath, wheezing and stridor.   Cardiovascular: Negative for chest pain, palpitations and leg swelling.  Endocrine: Negative for cold intolerance, heat intolerance, polydipsia, polyphagia and polyuria.  Neurological: Negative for dizziness and headaches.  Hematological: Does not bruise/bleed easily.  Psychiatric/Behavioral: Positive for sleep disturbance. The patient is nervous/anxious.        Objective:   Physical Exam Constitutional:      General: She is not in acute distress.    Appearance: She is obese. She is not ill-appearing, toxic-appearing or diaphoretic.  HENT:     Head: Normocephalic and atraumatic.  Cardiovascular:     Rate and Rhythm: Normal rate.     Pulses: Normal pulses.     Heart sounds: Normal heart sounds. No murmur. No friction rub. No gallop.   Pulmonary:     Effort: Pulmonary effort is normal. No respiratory distress.     Breath sounds: Normal breath sounds. No stridor. No wheezing, rhonchi or rales.  Chest:     Chest wall: No tenderness.  Skin:    Capillary Refill: Capillary refill takes less than 2 seconds.  Neurological:     Mental Status: She is alert and oriented to person, place, and time.  Psychiatric:        Attention and Perception: Attention and perception normal.        Mood and Affect: Mood normal. Mood is not anxious. Affect is not tearful.        Speech: Speech normal.        Behavior: Behavior normal.        Thought Content: Thought content normal.        Cognition and Memory: Cognition and memory normal.        Judgment: Judgment normal.       Assessment & Plan:   1. Generalized anxiety disorder   2. Depression, recurrent (HCC)   3. Elevated LFTs   4. Healthcare maintenance   5. HTN, goal below 130/80     Healthcare maintenance Continue all medications as directed. Continue to check blood pressure  and heart rate, call clinic if readings are consistently <100/60 or >140/90 Continue to increase regular exercise and follow heart healthy diet. Stop tobacco use and keep alcohol intake to 0-1 glasses of wine/night. Please return in 6 weeks for fasting labs and re-check liver function. Follow-up in 3 months. Referral to Psychology placed, someone will call to schedule an appt. GREAT TO SEE YOU!  Generalized anxiety disorder Referral to Psychology placed, re: GAD Propranolol 10mg  BID  HTN, goal below 130/80 Started on propranolol 10mg  BID BB chosen to treat HTN and GAD She reports home readings- SBP 110-120, mean low 120 DBP 80-90, mean 80 HR 70-90, mean 80    FOLLOW-UP:  Return in about 3 months (around 05/07/2019) for Regular Follow Up, CPE.

## 2019-02-06 ENCOUNTER — Ambulatory Visit: Payer: 59 | Admitting: Adult Health

## 2019-02-06 ENCOUNTER — Encounter: Payer: Self-pay | Admitting: Adult Health

## 2019-02-06 VITALS — BP 132/88 | HR 70 | Temp 98.7°F | Ht 65.0 in | Wt 193.5 lb

## 2019-02-06 DIAGNOSIS — R945 Abnormal results of liver function studies: Secondary | ICD-10-CM

## 2019-02-06 DIAGNOSIS — F339 Major depressive disorder, recurrent, unspecified: Secondary | ICD-10-CM | POA: Diagnosis not present

## 2019-02-06 DIAGNOSIS — F411 Generalized anxiety disorder: Secondary | ICD-10-CM

## 2019-02-06 DIAGNOSIS — Z0000 Encounter for general adult medical examination without abnormal findings: Secondary | ICD-10-CM

## 2019-02-06 DIAGNOSIS — I10 Essential (primary) hypertension: Secondary | ICD-10-CM

## 2019-02-06 DIAGNOSIS — R7989 Other specified abnormal findings of blood chemistry: Secondary | ICD-10-CM

## 2019-02-06 NOTE — Patient Instructions (Addendum)
Managing Your Hypertension Hypertension is commonly called high blood pressure. This is when the force of your blood pressing against the walls of your arteries is too strong. Arteries are blood vessels that carry blood from your heart throughout your body. Hypertension forces the heart to work harder to pump blood, and may cause the arteries to become narrow or stiff. Having untreated or uncontrolled hypertension can cause heart attack, stroke, kidney disease, and other problems. What are blood pressure readings? A blood pressure reading consists of a higher number over a lower number. Ideally, your blood pressure should be below 120/80. The first ("top") number is called the systolic pressure. It is a measure of the pressure in your arteries as your heart beats. The second ("bottom") number is called the diastolic pressure. It is a measure of the pressure in your arteries as the heart relaxes. What does my blood pressure reading mean? Blood pressure is classified into four stages. Based on your blood pressure reading, your health care provider may use the following stages to determine what type of treatment you need, if any. Systolic pressure and diastolic pressure are measured in a unit called mm Hg. Normal  Systolic pressure: below 120.  Diastolic pressure: below 80. Elevated  Systolic pressure: 120-129.  Diastolic pressure: below 80. Hypertension stage 1  Systolic pressure: 130-139.  Diastolic pressure: 80-89. Hypertension stage 2  Systolic pressure: 140 or above.  Diastolic pressure: 90 or above. What health risks are associated with hypertension? Managing your hypertension is an important responsibility. Uncontrolled hypertension can lead to:  A heart attack.  A stroke.  A weakened blood vessel (aneurysm).  Heart failure.  Kidney damage.  Eye damage.  Metabolic syndrome.  Memory and concentration problems. What changes can I make to manage my  hypertension? Hypertension can be managed by making lifestyle changes and possibly by taking medicines. Your health care provider will help you make a plan to bring your blood pressure within a normal range. Eating and drinking   Eat a diet that is high in fiber and potassium, and low in salt (sodium), added sugar, and fat. An example eating plan is called the DASH (Dietary Approaches to Stop Hypertension) diet. To eat this way: ? Eat plenty of fresh fruits and vegetables. Try to fill half of your plate at each meal with fruits and vegetables. ? Eat whole grains, such as whole wheat pasta, brown rice, or whole grain bread. Fill about one quarter of your plate with whole grains. ? Eat low-fat diary products. ? Avoid fatty cuts of meat, processed or cured meats, and poultry with skin. Fill about one quarter of your plate with lean proteins such as fish, chicken without skin, beans, eggs, and tofu. ? Avoid premade and processed foods. These tend to be higher in sodium, added sugar, and fat.  Reduce your daily sodium intake. Most people with hypertension should eat less than 1,500 mg of sodium a day.  Limit alcohol intake to no more than 1 drink a day for nonpregnant women and 2 drinks a day for men. One drink equals 12 oz of beer, 5 oz of wine, or 1 oz of hard liquor. Lifestyle  Work with your health care provider to maintain a healthy body weight, or to lose weight. Ask what an ideal weight is for you.  Get at least 30 minutes of exercise that causes your heart to beat faster (aerobic exercise) most days of the week. Activities may include walking, swimming, or biking.  Include exercise   to strengthen your muscles (resistance exercise), such as weight lifting, as part of your weekly exercise routine. Try to do these types of exercises for 30 minutes at least 3 days a week.  Do not use any products that contain nicotine or tobacco, such as cigarettes and e-cigarettes. If you need help quitting,  ask your health care provider.  Control any long-term (chronic) conditions you have, such as high cholesterol or diabetes. Monitoring  Monitor your blood pressure at home as told by your health care provider. Your personal target blood pressure may vary depending on your medical conditions, your age, and other factors.  Have your blood pressure checked regularly, as often as told by your health care provider. Working with your health care provider  Review all the medicines you take with your health care provider because there may be side effects or interactions.  Talk with your health care provider about your diet, exercise habits, and other lifestyle factors that may be contributing to hypertension.  Visit your health care provider regularly. Your health care provider can help you create and adjust your plan for managing hypertension. Will I need medicine to control my blood pressure? Your health care provider may prescribe medicine if lifestyle changes are not enough to get your blood pressure under control, and if:  Your systolic blood pressure is 130 or higher.  Your diastolic blood pressure is 80 or higher. Take medicines only as told by your health care provider. Follow the directions carefully. Blood pressure medicines must be taken as prescribed. The medicine does not work as well when you skip doses. Skipping doses also puts you at risk for problems. Contact a health care provider if:  You think you are having a reaction to medicines you have taken.  You have repeated (recurrent) headaches.  You feel dizzy.  You have swelling in your ankles.  You have trouble with your vision. Get help right away if:  You develop a severe headache or confusion.  You have unusual weakness or numbness, or you feel faint.  You have severe pain in your chest or abdomen.  You vomit repeatedly.  You have trouble breathing. Summary  Hypertension is when the force of blood pumping  through your arteries is too strong. If this condition is not controlled, it may put you at risk for serious complications.  Your personal target blood pressure may vary depending on your medical conditions, your age, and other factors. For most people, a normal blood pressure is less than 120/80.  Hypertension is managed by lifestyle changes, medicines, or both. Lifestyle changes include weight loss, eating a healthy, low-sodium diet, exercising more, and limiting alcohol. This information is not intended to replace advice given to you by your health care provider. Make sure you discuss any questions you have with your health care provider. Document Released: 08/31/2012 Document Revised: 11/04/2016 Document Reviewed: 11/04/2016 Elsevier Interactive Patient Education  2019 [employer]   Generalized Anxiety Disorder, Adult Generalized anxiety disorder (GAD) is a mental health disorder. People with this condition constantly worry about everyday events. Unlike normal anxiety, worry related to GAD is not triggered by a specific event. These worries also do not fade or get better with time. GAD interferes with life functions, including relationships, work, and school. GAD can vary from mild to severe. People with severe GAD can have intense waves of anxiety with physical symptoms (panic attacks). What are the causes? The exact cause of GAD is not known. What increases the risk? This condition  is more likely to develop in:  Women.  People who have a family history of anxiety disorders.  People who are very shy.  People who experience very stressful life events, such as the death of a loved one.  People who have a very stressful family environment. What are the signs or symptoms? People with GAD often worry excessively about many things in their lives, such as their health and family. They may also be overly concerned about:  Doing well at work.  Being on time.  Natural  disasters.  Friendships. Physical symptoms of GAD include:  Fatigue.  Muscle tension or having muscle twitches.  Trembling or feeling shaky.  Being easily startled.  Feeling like your heart is pounding or racing.  Feeling out of breath or like you cannot take a deep breath.  Having trouble falling asleep or staying asleep.  Sweating.  Nausea, diarrhea, or irritable bowel syndrome (IBS).  Headaches.  Trouble concentrating or remembering facts.  Restlessness.  Irritability. How is this diagnosed? Your health care provider can diagnose GAD based on your symptoms and medical history. You will also have a physical exam. The health care provider will ask specific questions about your symptoms, including how severe they are, when they started, and if they come and go. Your health care provider may ask you about your use of alcohol or drugs, including prescription medicines. Your health care provider may refer you to a mental health specialist for further evaluation. Your health care provider will do a thorough examination and may perform additional tests to rule out other possible causes of your symptoms. To be diagnosed with GAD, a person must have anxiety that:  Is out of his or her control.  Affects several different aspects of his or her life, such as work and relationships.  Causes distress that makes him or her unable to take part in normal activities.  Includes at least three physical symptoms of GAD, such as restlessness, fatigue, trouble concentrating, irritability, muscle tension, or sleep problems. Before your health care provider can confirm a diagnosis of GAD, these symptoms must be present more days than they are not, and they must last for six months or longer. How is this treated? The following therapies are usually used to treat GAD:  Medicine. Antidepressant medicine is usually prescribed for long-term daily control. Antianxiety medicines may be added in  severe cases, especially when panic attacks occur.  Talk therapy (psychotherapy). Certain types of talk therapy can be helpful in treating GAD by providing support, education, and guidance. Options include: ? Cognitive behavioral therapy (CBT). People learn coping skills and techniques to ease their anxiety. They learn to identify unrealistic or negative thoughts and behaviors and to replace them with positive ones. ? Acceptance and commitment therapy (ACT). This treatment teaches people how to be mindful as a way to cope with unwanted thoughts and feelings. ? Biofeedback. This process trains you to manage your body's response (physiological response) through breathing techniques and relaxation methods. You will work with a therapist while machines are used to monitor your physical symptoms.  Stress management techniques. These include yoga, meditation, and exercise. A mental health specialist can help determine which treatment is best for you. Some people see improvement with one type of therapy. However, other people require a combination of therapies. Follow these instructions at home:  Take over-the-counter and prescription medicines only as told by your health care provider.  Try to maintain a normal routine.  Try to anticipate stressful situations and  allow extra time to manage them.  Practice any stress management or self-calming techniques as taught by your health care provider.  Do not punish yourself for setbacks or for not making progress.  Try to recognize your accomplishments, even if they are small.  Keep all follow-up visits as told by your health care provider. This is important. Contact a health care provider if:  Your symptoms do not get better.  Your symptoms get worse.  You have signs of depression, such as: ? A persistently sad, cranky, or irritable mood. ? Loss of enjoyment in activities that used to bring you joy. ? Change in weight or eating. ? Changes in  sleeping habits. ? Avoiding friends or family members. ? Loss of energy for normal tasks. ? Feelings of guilt or worthlessness. Get help right away if:  You have serious thoughts about hurting yourself or others. If you ever feel like you may hurt yourself or others, or have thoughts about taking your own life, get help right away. You can go to your nearest emergency department or call:  Your local emergency services (911 in the [place]  A suicide crisis helpline, such as the National Suicide Prevention Lifeline at 267-319-3073. This is open 24 hours a day. Summary  Generalized anxiety disorder (GAD) is a mental health disorder that involves worry that is not triggered by a specific event.  People with GAD often worry excessively about many things in their lives, such as their health and family.  GAD may cause physical symptoms such as restlessness, trouble concentrating, sleep problems, frequent sweating, nausea, diarrhea, headaches, and trembling or muscle twitching.  A mental health specialist can help determine which treatment is best for you. Some people see improvement with one type of therapy. However, other people require a combination of therapies. This information is not intended to replace advice given to you by your health care provider. Make sure you discuss any questions you have with your health care provider. Document Released: 04/03/2013 Document Revised: 10/27/2016 Document Reviewed: 10/27/2016 Elsevier Interactive Patient Education  2019 [employer].  Continue all medications as directed. Continue to check blood pressure and heart rate, call clinic if readings are consistently <100/60 or >140/90 Continue to increase regular exercise and follow heart healthy diet. Stop tobacco use and keep alcohol intake to 0-1 glasses of wine/night. Please return in 6 weeks for fasting labs and re-check liver function. Follow-up in 3 months. Referral to Psychology placed, someone  will call to schedule an appt. GREAT TO SEE YOU!

## 2019-02-06 NOTE — Assessment & Plan Note (Signed)
Referral to Psychology placed, re: GAD Propranolol 10mg  BID

## 2019-02-06 NOTE — Assessment & Plan Note (Signed)
Continue all medications as directed. Continue to check blood pressure and heart rate, call clinic if readings are consistently <100/60 or >140/90 Continue to increase regular exercise and follow heart healthy diet. Stop tobacco use and keep alcohol intake to 0-1 glasses of wine/night. Please return in 6 weeks for fasting labs and re-check liver function. Follow-up in 3 months. Referral to Psychology placed, someone will call to schedule an appt. GREAT TO SEE YOU!

## 2019-02-06 NOTE — Assessment & Plan Note (Signed)
Started on propranolol 10mg  BID BB chosen to treat HTN and GAD She reports home readings- SBP 110-120, mean low 120 DBP 80-90, mean 80 HR 70-90, mean 80

## 2019-03-16 ENCOUNTER — Other Ambulatory Visit: Payer: Self-pay

## 2019-03-18 ENCOUNTER — Other Ambulatory Visit: Payer: Self-pay | Admitting: Adult Health

## 2019-04-13 ENCOUNTER — Other Ambulatory Visit: Payer: Self-pay | Admitting: Adult Health

## 2019-05-06 ENCOUNTER — Other Ambulatory Visit: Payer: Self-pay | Admitting: Adult Health

## 2019-05-08 ENCOUNTER — Other Ambulatory Visit: Payer: Self-pay

## 2019-05-08 ENCOUNTER — Ambulatory Visit: Payer: 59 | Admitting: Adult Health

## 2019-05-22 ENCOUNTER — Other Ambulatory Visit: Payer: Self-pay | Admitting: Adult Health

## 2019-07-05 ENCOUNTER — Telehealth: Payer: Self-pay | Admitting: Adult Health

## 2019-07-05 NOTE — Telephone Encounter (Signed)
-----   Message from Jerilee Field, [place] sent at 05/23/2019  3:35 PM EDT ----- Patient is due for follow up, please call the patient to make an appointment. 15 days of medication sent in for patient  Thanks. MPulliam, CMA/RT(R)

## 2019-07-05 NOTE — Telephone Encounter (Signed)
Called pt to set up provider required OV/ TELEHEALTH for Rx refill--- left message as there was no answer.  ---Brandi James to medical assistant.  -glh

## 2020-02-01 ENCOUNTER — Other Ambulatory Visit: Payer: 59

## 2020-02-02 ENCOUNTER — Ambulatory Visit: Payer: 59 | Attending: Internal Medicine

## 2020-02-02 DIAGNOSIS — Z20822 Contact with and (suspected) exposure to covid-19: Secondary | ICD-10-CM

## 2020-02-05 LAB — NOVEL CORONAVIRUS, NAA: SARS-CoV-2, NAA: NOT DETECTED

## 2020-04-15 ENCOUNTER — Ambulatory Visit (INDEPENDENT_AMBULATORY_CARE_PROVIDER_SITE_OTHER): Payer: 59

## 2020-04-15 ENCOUNTER — Ambulatory Visit: Payer: 59 | Admitting: Podiatrist

## 2020-04-15 ENCOUNTER — Other Ambulatory Visit: Payer: Self-pay

## 2020-04-15 ENCOUNTER — Encounter: Payer: Self-pay | Admitting: Podiatrist

## 2020-04-15 ENCOUNTER — Other Ambulatory Visit: Payer: Self-pay | Admitting: Podiatry

## 2020-04-15 VITALS — Temp 96.9°F

## 2020-04-15 DIAGNOSIS — M79672 Pain in left foot: Secondary | ICD-10-CM | POA: Diagnosis not present

## 2020-04-15 DIAGNOSIS — G629 Polyneuropathy, unspecified: Secondary | ICD-10-CM

## 2020-04-15 DIAGNOSIS — M722 Plantar fascial fibromatosis: Secondary | ICD-10-CM | POA: Diagnosis not present

## 2020-04-15 DIAGNOSIS — M79671 Pain in right foot: Secondary | ICD-10-CM | POA: Diagnosis not present

## 2020-04-15 MED ORDER — NONFORMULARY OR COMPOUNDED ITEM: 3 refills | Status: DC

## 2020-04-15 MED ORDER — GABAPENTIN 100 MG PO CAPS: 100.0000 mg | ORAL_CAPSULE | Freq: Three times a day (TID) | ORAL | 3 refills | Status: DC

## 2020-04-15 NOTE — Patient Instructions (Addendum)
I have ordered a medication for you that will come from [facility] in [place]. They should be calling you to verify insurance and will mail the medication to you. If you live close by then you can go by their pharmacy to pick up the medication. Their phone number is [phone]. If you do not hear from them in the next few days, please give [place] a call at [phone].    Neuropathic Pain Neuropathic pain is pain caused by damage to the nerves that are responsible for certain sensations in your body (sensory nerves). The pain can be caused by:  Damage to the sensory nerves that send signals to your spinal cord and brain (peripheral nervous system).  Damage to the sensory nerves in your brain or spinal cord (central nervous system). Neuropathic pain can make you more sensitive to pain. Even a minor sensation can feel very painful. This is usually a long-term condition that can be difficult to treat. The type of pain differs from person to person. It may:  Start suddenly (acute), or it may develop slowly and last for a long time (chronic).  Come and go as damaged nerves heal, or it may stay at the same level for years.  Cause emotional distress, loss of sleep, and a lower quality of life. What are the causes? The most common cause of this condition is diabetes. Many other diseases and conditions can also cause neuropathic pain. Causes of neuropathic pain can be classified as:  Toxic. This is caused by medicines and chemicals. The most common cause of toxic neuropathic pain is damage from cancer treatments (chemotherapy).  Metabolic. This can be caused by: ? Diabetes. This is the most common disease that damages the nerves. ? Lack of vitamin B from long-term alcohol abuse.  Traumatic. Any injury that cuts, crushes, or stretches a nerve can cause damage and pain. A common example is feeling pain after losing an arm or leg (phantom limb pain).  Compression-related. If a sensory nerve  gets trapped or compressed for a long period of time, the blood supply to the nerve can be cut off.  Vascular. Many blood vessel diseases can cause neuropathic pain by decreasing blood supply and oxygen to nerves.  Autoimmune. This type of pain results from diseases in which the body's defense system (immune system) mistakenly attacks sensory nerves. Examples of autoimmune diseases that can cause neuropathic pain include lupus and multiple sclerosis.  Infectious. Many types of viral infections can damage sensory nerves and cause pain. Shingles infection is a common cause of this type of pain.  Inherited. Neuropathic pain can be a symptom of many diseases that are passed down through families (genetic). What increases the risk? You are more likely to develop this condition if:  You have diabetes.  You smoke.  You drink too much alcohol.  You are taking certain medicines, including medicines that kill cancer cells (chemotherapy) or that treat immune system disorders. What are the signs or symptoms? The main symptom is pain. Neuropathic pain is often described as:  Burning.  Shock-like.  Stinging.  Hot or cold.  Itching. How is this diagnosed? No single test can diagnose neuropathic pain. It is diagnosed based on:  Physical exam and your symptoms. Your health care provider will ask you about your pain. You may be asked to use a pain scale to describe how bad your pain is.  Tests. These may be done to see if you have a high sensitivity to pain and to help  find the cause and location of any sensory nerve damage. They include: ? Nerve conduction studies to test how well nerve signals travel through your sensory nerves (electrodiagnostic testing). ? Stimulating your sensory nerves through electrodes on your skin and measuring the response in your spinal cord and brain (somatosensory evoked potential).  Imaging studies, such as: ? X-rays. ? CT scan. ? MRI. How is this  treated? Treatment for neuropathic pain may change over time. You may need to try different treatment options or a combination of treatments. Some options include:  Treating the underlying cause of the neuropathy, such as diabetes, kidney disease, or vitamin deficiencies.  Stopping medicines that can cause neuropathy, such as chemotherapy.  Medicine to relieve pain. Medicines may include: ? Prescription or over-the-counter pain medicine. ? Anti-seizure medicine. ? Antidepressant medicines. ? Pain-relieving patches that are applied to painful areas of skin. ? A medicine to numb the area (local anesthetic), which can be injected as a nerve block.  Transcutaneous nerve stimulation. This uses electrical currents to block painful nerve signals. The treatment is painless.  Alternative treatments, such as: ? Acupuncture. ? Meditation. ? Massage. ? Physical therapy. ? Pain management programs. ? Counseling. Follow these instructions at home: Medicines   Take over-the-counter and prescription medicines only as told by your health care provider.  Do not drive or use heavy machinery while taking prescription pain medicine.  If you are taking prescription pain medicine, take actions to prevent or treat constipation. Your health care provider may recommend that you: ? Drink enough fluid to keep your urine pale yellow. ? Eat foods that are high in fiber, such as fresh fruits and vegetables, whole grains, and beans. ? Limit foods that are high in fat and processed sugars, such as fried or sweet foods. ? Take an over-the-counter or prescription medicine for constipation. Lifestyle   Have a good support system at home.  Consider joining a chronic pain support group.  Do not use any products that contain nicotine or tobacco, such as cigarettes and e-cigarettes. If you need help quitting, ask your health care provider.  Do not drink alcohol. General instructions  Learn as much as you can  about your condition.  Work closely with all your health care providers to find the treatment plan that works best for you.  Ask your health care provider what activities are safe for you.  Keep all follow-up visits as told by your health care provider. This is important. Contact a health care provider if:  Your pain treatments are not working.  You are having side effects from your medicines.  You are struggling with tiredness (fatigue), mood changes, depression, or anxiety. Summary  Neuropathic pain is pain caused by damage to the nerves that are responsible for certain sensations in your body (sensory nerves).  Neuropathic pain may come and go as damaged nerves heal, or it may stay at the same level for years.  Neuropathic pain is usually a long-term condition that can be difficult to treat. Consider joining a chronic pain support group. This information is not intended to replace advice given to you by your health care provider. Make sure you discuss any questions you have with your health care provider. Document Revised: 03/30/2019 Document Reviewed: 12/24/2017 Elsevier Patient Education  [place].

## 2020-04-15 NOTE — Addendum Note (Signed)
Addended by: Alphia Kava D on: 04/15/2020 04:40 PM   Modules accepted: Orders

## 2020-04-15 NOTE — Progress Notes (Signed)
  Chief Complaint  Patient presents with  . Foot Pain    BL ball of foot/L bottom midfoot; "tingling and numbness ball of foot; have knot on bottom of L thats gets irritated when on feet a while; x54yr"     HPI: Patient is 39 y.o. female who presents today for the concerns as listed above.    Review of Systems No fevers, chills, nausea, muscle aches, no difficulty breathing, no calf pain, no chest pain or shortness of breath.   Physical Exam  GENERAL APPEARANCE: Alert, conversant. Appropriately groomed. No acute distress.   VASCULAR: Pedal pulses palpable DP and PT bilateral.  Capillary refill time is immediate to all digits,  Proximal to distal cooling it warm to warm.  Digital hair growth is present bilateral   NEUROLOGIC: sensation is intact epicritically and protectively to 5.07 monofilament at 3/5 sites bilateral.  Light touch is intact bilateral, vibratory sensation decreased bilateral, achilles tendon reflex is intact bilateral.   MUSCULOSKELETAL: acceptable muscle strength, tone and stability bilateral.  No gross boney pedal deformities noted.  No pain, crepitus or limitation noted with foot and ankle range of motion bilateral.   DERMATOLOGIC: skin is warm, supple, and dry.  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 Measures approximately 1.5 cm x 11mm in diameter.  No pain with palpation or pressure.    Assessment     ICD-10-CM   1. Bilateral foot pain  M79.671 DG Foot Complete Left   M79.672 CANCELED: DG Foot Complete Right  2. 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  Also will try her on a verapamil cream for the fibroma.  We can also inject it with steroid if it becomes painful.  She will follow up as needed for the fibroma and will let me know how the gabapentin trial is doing.

## 2020-08-07 ENCOUNTER — Telehealth: Payer: Self-pay | Admitting: Physician Assistant

## 2020-08-07 NOTE — Telephone Encounter (Signed)
Called to discuss with patient about Covid symptoms and the use of bamlanivimab/etesevimab or casirivimab/imdevimab, a monoclonal antibody infusion for those with mild to moderate Covid symptoms and at a high risk of hospitalization.  Pt is qualified for this infusion at the [facility] due to; Specific high risk criteria : BMI > 25   Message left to call back our hotline [phone]. I also sent you a myhcart message.  Cline Crock PA-C  [facility]

## 2020-08-08 ENCOUNTER — Other Ambulatory Visit: Payer: Self-pay | Admitting: Hospice and Palliative Medicine

## 2020-08-08 ENCOUNTER — Ambulatory Visit (HOSPITAL_COMMUNITY)
Admission: RE | Admit: 2020-08-08 | Discharge: 2020-08-08 | Disposition: A | Discharge status: Home / Self Care | Payer: 59 | Source: Ambulatory Visit | Attending: Pulmonary Disease | Admitting: Pulmonary Disease

## 2020-08-08 ENCOUNTER — Encounter: Payer: Self-pay | Admitting: Hospice and Palliative Medicine

## 2020-08-08 DIAGNOSIS — U071 COVID-19: Secondary | ICD-10-CM

## 2020-08-08 MED ORDER — METHYLPREDNISOLONE SODIUM SUCC 125 MG IJ SOLR: 125.0000 mg | Freq: Once | INTRAMUSCULAR | Status: DC | PRN

## 2020-08-08 MED ORDER — ACETAMINOPHEN 325 MG PO TABS
650.0000 mg | ORAL_TABLET | Freq: Once | ORAL | Status: AC
  Administered 2020-08-08: 650 mg via ORAL
  Filled 2020-08-08: qty 2

## 2020-08-08 MED ORDER — DIPHENHYDRAMINE HCL 50 MG/ML IJ SOLN: 50.0000 mg | Freq: Once | INTRAMUSCULAR | Status: DC | PRN

## 2020-08-08 MED ORDER — EPINEPHRINE 0.3 MG/0.3ML IJ SOAJ: 0.3000 mg | Freq: Once | INTRAMUSCULAR | Status: DC | PRN

## 2020-08-08 MED ORDER — SODIUM CHLORIDE 0.9 % IV SOLN
1200.0000 mg | Freq: Once | INTRAVENOUS | Status: AC
  Administered 2020-08-08: 1200 mg via INTRAVENOUS
  Filled 2020-08-08: qty 10

## 2020-08-08 MED ORDER — FAMOTIDINE IN NACL 20-0.9 MG/50ML-% IV SOLN: 20.0000 mg | Freq: Once | INTRAVENOUS | Status: DC | PRN

## 2020-08-08 MED ORDER — SODIUM CHLORIDE 0.9 % IV SOLN: INTRAVENOUS | Status: DC | PRN

## 2020-08-08 MED ORDER — ALBUTEROL SULFATE HFA 108 (90 BASE) MCG/ACT IN AERS: 2.0000 | INHALATION_SPRAY | Freq: Once | RESPIRATORY_TRACT | Status: DC | PRN

## 2020-08-08 NOTE — Progress Notes (Signed)
  Diagnosis: COVID-19  Physician:Dr Wright  Procedure: Covid Infusion Clinic Med: casirivimab\imdevimab infusion - Provided patient with casirivimab\imdevimab fact sheet for patients, parents and caregivers prior to infusion.  Complications: No immediate complications noted.  Discharge: Discharged home   Brandi James 08/08/2020  

## 2020-08-08 NOTE — Progress Notes (Signed)
I connected by phone with  Brandi James on 08/08/2020 at @now  to discuss the potential use of an new treatment for mild to moderate COVID-19 viral infection in non-hospitalized patients.   This patient is a age/sex that meets the FDA criteria for Emergency Use Authorization of casirivimab\imdevimab.  Has a (+) direct SARS-CoV-2 viral test result 1. Has mild or moderate COVID-19  2. Is ? 41 years of age and weighs ? 40 kg 3. Is NOT hospitalized due to COVID-19 4. Is NOT requiring oxygen therapy or requiring an increase in baseline oxygen flow rate due to COVID-19 5. Is within 10 days of symptom onset 6. Has at least one of the high risk factor(s) for progression to severe COVID-19 and/or hospitalization as defined in EUA. ? Specific high risk criteria : Obseity BMI 32     Symptom onset: 8/16 with body aches, fever (tmax 101), congestion, shortness of breath, and non productive cough. Tested positive at urgent care on 8/18.   I have spoken and communicated the following to the patient or parent/caregiver:   1. FDA has authorized the emergency use of casirivimab\imdevimab for the treatment of mild to moderate COVID-19 in adults and pediatric patients with positive results of direct SARS-CoV-2 viral testing who are 56 years of age and older weighing at least 40 kg, and who are at high risk for progressing to severe COVID-19 and/or hospitalization.   2. The significant known and potential risks and benefits of casirivimab\imdevimab, and the extent to which such potential risks and benefits are unknown.   3. Information on available alternative treatments and the risks and benefits of those alternatives, including clinical trials.   4. Patients treated with casirivimab\imdevimab should continue to self-isolate and use infection control measures (e.g., wear mask, isolate, social distance, avoid sharing personal items, clean and disinfect "high touch" surfaces, and frequent handwashing) according to  CDC guidelines.    5. The patient or parent/caregiver has the option to accept or refuse casirivimab\imdevimab .   After reviewing this information with the patient, The patient agreed to proceed with receiving casirivimab\imdevimab infusion and will be provided a copy of the Fact sheet prior to receiving the infusion.14, PhD, NP-C [phone] (Infusion Center Hotline)

## 2020-08-08 NOTE — Discharge Instructions (Signed)

## 2020-12-26 ENCOUNTER — Telehealth: Payer: Self-pay | Admitting: Physician Assistant

## 2020-12-26 ENCOUNTER — Other Ambulatory Visit: Payer: 59

## 2020-12-26 ENCOUNTER — Ambulatory Visit
Admission: RE | Admit: 2020-12-26 | Discharge: 2020-12-26 | Disposition: A | Discharge status: Home / Self Care | Payer: 59 | Source: Ambulatory Visit | Attending: Internal Medicine | Admitting: Internal Medicine

## 2020-12-26 ENCOUNTER — Other Ambulatory Visit: Payer: Self-pay

## 2020-12-26 VITALS — BP 144/100 | HR 94 | Temp 98.4°F | Resp 20 | Ht 65.0 in | Wt 197.0 lb

## 2020-12-26 DIAGNOSIS — R7989 Other specified abnormal findings of blood chemistry: Secondary | ICD-10-CM

## 2020-12-26 DIAGNOSIS — F411 Generalized anxiety disorder: Secondary | ICD-10-CM

## 2020-12-26 DIAGNOSIS — R030 Elevated blood-pressure reading, without diagnosis of hypertension: Secondary | ICD-10-CM | POA: Diagnosis not present

## 2020-12-26 DIAGNOSIS — Z0000 Encounter for general adult medical examination without abnormal findings: Secondary | ICD-10-CM

## 2020-12-26 MED ORDER — ESCITALOPRAM OXALATE 5 MG PO TABS: 5.0000 mg | ORAL_TABLET | Freq: Every day | ORAL | 0 refills | Status: DC

## 2020-12-26 MED ORDER — HYDROXYZINE HCL 25 MG PO TABS: 25.0000 mg | ORAL_TABLET | Freq: Three times a day (TID) | ORAL | 0 refills | Status: DC | PRN

## 2020-12-26 NOTE — Discharge Instructions (Signed)
Please take medications as directed If you have worsening symptoms please return to your [facility] physician Set the time of the day to check your blood pressure Decrease salt intake to 2 g or less a day Please exercise for at least 150 minutes a week.

## 2020-12-26 NOTE — ED Triage Notes (Signed)
Pt had appt with PCP recently and was told to monitor BP closely. Her home record of BP ranges 130s SBPs to 171/111 highest. Pt reports feeling anxious and stressed d/t her job [employer]in [employer] - [employer]). No other concerns today aside from BP and anxiety. Of note, pt has umbilical hernia that will be checked with PCP soon. She also stated "I am an alcoholic and I drink more than I should". Counseling offered.

## 2020-12-26 NOTE — ED Provider Notes (Signed)
[facility]    CSN: [id number] Arrival date & time: 12/26/20  0932      History   Chief Complaint Elevated blood pressure, anxiety  HPI Brandi James is a 40 y.o. female with a history of anxiety comes to urgent care to be evaluated.  Patient was sent from the primary care physician's office to come for blood pressure evaluation.  Patient was physically present at the primary care physician's office and was sent to the urgent care because he did not have an appointment.  The patient was at the primary care physician's office for blood draw.   Patient has a history of anxiety and has been complaining about blood pressure elevation over the past several months.  She checks her blood pressures at home routinely and has some elevated blood pressure readings.  She has increased anxiety over the past months and has taken to drinking wine to help with the anxiety.  She currently drinks about 2 bottles of wine a day.  She is complaining of increased anxiety, poor sleep and was tearful during the interaction.  She is feeling very stressed from work.  She is a Education officer, [employer].  She denies any suicidal or homicidal ideation.  She has some headache which is mild and intermittent.  No chest pain or chest pressure.  No abdominal pain.  HPI  Past Medical History:  Diagnosis Date  . Depression   . Panic attacks   . Panic disorder     Patient Active Problem List   Diagnosis Date Noted  . Elevated TSH 01/24/2019  . HTN, goal below 130/80 01/23/2019  . Obesity (BMI 30.0-34.9) 09/08/2017  . Healthcare maintenance 09/08/2017  . Severe episode of recurrent major depressive disorder, without psychotic features (HCC)   . Severe major depression without psychotic features (HCC) 12/19/2015  . Generalized anxiety disorder 12/19/2015    Past Surgical History:  Procedure Laterality Date  . abdominoplasty    . CHOLECYSTECTOMY    . GASTRIC BYPASS  2003    OB History   No obstetric  history on file.      Home Medications    Prior to Admission medications   Medication Sig Start Date End Date Taking? Authorizing Provider  escitalopram (LEXAPRO) 5 MG tablet Take 1 tablet (5 mg total) by mouth daily. 12/26/20 01/25/21 Yes Marcayla Budge, Britta Mccreedy, MD  hydrOXYzine (ATARAX/VISTARIL) 25 MG tablet Take 1 tablet (25 mg total) by mouth every 8 (eight) hours as needed for anxiety. 12/26/20  Yes Mylon Mabey, Britta Mccreedy, MD  gabapentin (NEURONTIN) 100 MG capsule Take 1 capsule (100 mg total) by mouth 3 (three) times daily. Start one capsule at night, then may add one capsule at dinner, then may add one capsule at breakfast 04/15/20 12/26/20  Delories Heinz, DPM  propranolol (INDERAL) 10 MG tablet TAKE 1 TABLET BY MOUTH 2 TIMES DAILY. PATIENT NEEDS TELEMEDICINE VISIT PRIOR TO ANY FURTHER REFILLS Patient not taking: No sig reported 05/23/19 12/26/20  Julaine Fusi, NP    Family History Family History  Problem Relation Age of Onset  . Healthy Mother   . Diabetes Father   . Hypertension Father     Social History Social History   Tobacco Use  . Smoking status: Current Some Day Smoker    Packs/day: 0.50    Types: Cigarettes  . Smokeless tobacco: Never Used  Vaping Use  . Vaping Use: Never used  Substance Use Topics  . Alcohol use: Yes    Alcohol/week:  12.0 standard drinks    Types: 12 Glasses of wine per week    Comment: 2 bottles of wine daily *  . Drug use: No     Allergies   Morphine and related   Review of Systems Review of Systems  Constitutional: Negative.   HENT: Negative.   Cardiovascular: Negative for chest pain.  Gastrointestinal: Negative for abdominal pain.  Musculoskeletal: Negative.   Skin: Negative.   Psychiatric/Behavioral: Positive for sleep disturbance. Negative for behavioral problems, confusion, self-injury and suicidal ideas. The patient is nervous/anxious.      Physical Exam Triage Vital Signs ED Triage Vitals  Enc Vitals Group     BP 12/26/20 1107  (!) 144/100     Pulse Rate 12/26/20 1107 94     Resp 12/26/20 1107 20     Temp 12/26/20 1107 98.4 F (36.9 C)     Temp Source 12/26/20 1107 Oral     SpO2 12/26/20 1107 95 %     Weight 12/26/20 1059 197 lb (89.4 kg)     Height 12/26/20 1059 5\' 5"  (1.651 m)     Head Circumference --      Peak Flow --      Pain Score 12/26/20 1107 0     Pain Loc --      Pain Edu? --      Excl. in GC? --    No data found.  Updated Vital Signs BP (!) 144/100 (BP Location: Left Arm)   Pulse 94   Temp 98.4 F (36.9 C) (Oral)   Resp 20   Ht 5\' 5"  (1.651 m)   Wt 89.4 kg   LMP 12/26/2020 (Exact Date)   SpO2 95%   BMI 32.78 kg/m   Visual Acuity Right Eye Distance:   Left Eye Distance:   Bilateral Distance:    Right Eye Near:   Left Eye Near:    Bilateral Near:     Physical Exam Vitals and nursing note reviewed.  Constitutional:      General: She is not in acute distress.    Appearance: She is not ill-appearing.  Cardiovascular:     Rate and Rhythm: Normal rate and regular rhythm.  Abdominal:     General: Abdomen is flat. Bowel sounds are normal.  Musculoskeletal:        General: Normal range of motion.  Skin:    General: Skin is warm.     Capillary Refill: Capillary refill takes less than 2 seconds.  Neurological:     General: No focal deficit present.     Mental Status: She is alert. Mental status is at baseline. She is disoriented.     Cranial Nerves: No cranial nerve deficit.     Sensory: No sensory deficit.     Motor: No weakness.     Coordination: Coordination normal.     Gait: Gait normal.     Deep Tendon Reflexes: Reflexes normal.  Psychiatric:        Mood and Affect: Mood normal.        Behavior: Behavior normal.        Thought Content: Thought content normal.        Judgment: Judgment normal.      UC Treatments / Results  Labs (all labs ordered are listed, but only abnormal results are displayed) Labs Reviewed - No data to display  EKG   Radiology No  results found.  Procedures Procedures (including critical care time)  Medications Ordered in UC Medications -  No data to display  Initial Impression / Assessment and Plan / UC Course  I have reviewed the triage vital signs and the nursing notes.  Pertinent labs & imaging results that were available during my care of the patient were reviewed by me and considered in my medical decision making (see chart for details).     1.  Generalized anxiety disorder: Lexapro 5 mg orally daily Hydroxyzine 25 mg every 6 hours as needed for anxiety Patient has no suicidal or homicidal ideation Patient is advised to call 911 if she has suicidal homicidal ideation. She needs to follow-up with primary care physician for further evaluation regarding the anxiety.  2.  Elevated blood pressure: Patient is advised to continue monitoring blood pressure Decrease salt intake to 2 g or less a day Increase physical activity to include exercise of 150 minutes a week Follow-up with primary care physician. Final Clinical Impressions(s) / UC Diagnoses   Final diagnoses:  Elevated BP without diagnosis of hypertension  Generalized anxiety disorder     Discharge Instructions     Please take medications as directed If you have worsening symptoms please return to your [facility] physician Set the time of the day to check your blood pressure Decrease salt intake to 2 g or less a day Please exercise for at least 150 minutes a week.   ED Prescriptions    Medication Sig Dispense Auth. Provider   escitalopram (LEXAPRO) 5 MG tablet Take 1 tablet (5 mg total) by mouth daily. 30 tablet Ryatt Corsino, Myrene Galas, MD   hydrOXYzine (ATARAX/VISTARIL) 25 MG tablet Take 1 tablet (25 mg total) by mouth every 8 (eight) hours as needed for anxiety. 60 tablet Parish Augustine, Myrene Galas, MD     PDMP not reviewed this encounter.   Chase Picket, MD 12/26/20 [phone]

## 2020-12-26 NOTE — Telephone Encounter (Signed)
Patient in office today for labs. Patient states she has been having high BP for past several week. Patient reports BP being 170/110. Patient reports that she is having a headache yesterday but not today. Patient advised with BP this high she is at risk for stroke, etc. Patient advised that she needs to be seen today either at [facility] or ER since we are unable to see her. Patient verbalized understanding and was going to UC now. AS, CMA

## 2020-12-27 LAB — CBC
Hematocrit: 37.6 % (ref 34.0–46.6)
Hemoglobin: 13.3 g/dL (ref 11.1–15.9)
MCH: 35.7 pg — ABNORMAL HIGH (ref 26.6–33.0)
MCHC: 35.4 g/dL (ref 31.5–35.7)
MCV: 101 fL — ABNORMAL HIGH (ref 79–97)
Platelets: 120 10*3/uL — ABNORMAL LOW (ref 150–450)
RBC: 3.73 x10E6/uL — ABNORMAL LOW (ref 3.77–5.28)
RDW: 15 % (ref 11.7–15.4)
WBC: 5.7 10*3/uL (ref 3.4–10.8)

## 2020-12-27 LAB — LIPID PANEL
Chol/HDL Ratio: 5.2 ratio — ABNORMAL HIGH (ref 0.0–4.4)
Cholesterol, Total: 191 mg/dL (ref 100–199)
HDL: 37 mg/dL — ABNORMAL LOW (ref >39)
LDL Chol Calc (NIH): 132 mg/dL — ABNORMAL HIGH (ref 0–99)
Triglycerides: 119 mg/dL (ref 0–149)
VLDL Cholesterol Cal: 22 mg/dL (ref 5–40)

## 2020-12-27 LAB — COMPREHENSIVE METABOLIC PANEL
ALT: 57 IU/L — ABNORMAL HIGH (ref 0–32)
AST: 192 IU/L — ABNORMAL HIGH (ref 0–40)
Albumin/Globulin Ratio: 1.1 — ABNORMAL LOW (ref 1.2–2.2)
Albumin: 4.2 g/dL (ref 3.8–4.8)
Alkaline Phosphatase: 115 IU/L (ref 44–121)
BUN/Creatinine Ratio: 7 — ABNORMAL LOW (ref 9–23)
BUN: 4 mg/dL — ABNORMAL LOW (ref 6–24)
Bilirubin Total: 1.6 mg/dL — ABNORMAL HIGH (ref 0.0–1.2)
CO2: 20 mmol/L (ref 20–29)
Calcium: 9.4 mg/dL (ref 8.7–10.2)
Chloride: 97 mmol/L (ref 96–106)
Creatinine, Ser: 0.59 mg/dL (ref 0.57–1.00)
GFR calc Af Amer: 132 mL/min/{1.73_m2} (ref >59)
GFR calc non Af Amer: 114 mL/min/{1.73_m2} (ref >59)
Globulin, Total: 3.8 g/dL (ref 1.5–4.5)
Glucose: 103 mg/dL — ABNORMAL HIGH (ref 65–99)
Potassium: 3.8 mmol/L (ref 3.5–5.2)
Sodium: 136 mmol/L (ref 134–144)
Total Protein: 8 g/dL (ref 6.0–8.5)

## 2020-12-27 LAB — HEMOGLOBIN A1C
Est. average glucose Bld gHb Est-mCnc: 111 mg/dL
Hgb A1c MFr Bld: 5.5 % (ref 4.8–5.6)

## 2020-12-27 LAB — TSH: TSH: 3.26 u[IU]/mL (ref 0.450–4.500)

## 2021-01-21 ENCOUNTER — Ambulatory Visit (INDEPENDENT_AMBULATORY_CARE_PROVIDER_SITE_OTHER): Payer: 59 | Admitting: Physician Assistant

## 2021-01-21 ENCOUNTER — Other Ambulatory Visit: Payer: Self-pay

## 2021-01-21 ENCOUNTER — Encounter: Payer: Self-pay | Admitting: Physician Assistant

## 2021-01-21 VITALS — BP 130/83 | HR 92 | Temp 99.1°F | Ht 65.0 in | Wt 198.3 lb

## 2021-01-21 DIAGNOSIS — F411 Generalized anxiety disorder: Secondary | ICD-10-CM

## 2021-01-21 DIAGNOSIS — Z0000 Encounter for general adult medical examination without abnormal findings: Secondary | ICD-10-CM

## 2021-01-21 DIAGNOSIS — Z9884 Bariatric surgery status: Secondary | ICD-10-CM

## 2021-01-21 DIAGNOSIS — Z23 Encounter for immunization: Secondary | ICD-10-CM | POA: Diagnosis not present

## 2021-01-21 DIAGNOSIS — Z7141 Alcohol abuse counseling and surveillance of alcoholic: Secondary | ICD-10-CM

## 2021-01-21 DIAGNOSIS — R1900 Intra-abdominal and pelvic swelling, mass and lump, unspecified site: Secondary | ICD-10-CM

## 2021-01-21 DIAGNOSIS — G629 Polyneuropathy, unspecified: Secondary | ICD-10-CM

## 2021-01-21 DIAGNOSIS — F1010 Alcohol abuse, uncomplicated: Secondary | ICD-10-CM

## 2021-01-21 DIAGNOSIS — Z1231 Encounter for screening mammogram for malignant neoplasm of breast: Secondary | ICD-10-CM

## 2021-01-21 NOTE — Progress Notes (Signed)
Female Physical   Impression and Recommendations:    1. Healthcare maintenance   2. Encounter for screening mammogram for malignant neoplasm of breast   3. Need for influenza vaccination   4. Abdominal mass, unspecified abdominal location   5. Alcohol abuse   6. Encounter for alcohol cessation counseling   7. Neuropathy   8. Generalized anxiety disorder   9. History of gastric bypass      1) Anticipatory Guidance: Skin CA prevention- recommend to use sunscreen when outside along with skin surveillance; eating a balanced and modest diet; physical activity at least 25 minutes per day or minimum of 150 min/ week moderate to intense activity.  2) Immunizations / Screenings / Labs:   All immunizations are up-to-date per recommendations or will be updated today if pt allows.    - Patient understands with dental and vision screens they will schedule independently.  - Obtained CBC, CMP, HgA1c, Lipid panel, and TSH, if not already done past 12 mo/ recently.  Most labs recently obtained are essentially within normal limits or stable from prior with the exception of CBC, hepatic function and hyperlipidemia. -Will be due for Tdap May 19 2021. -Placed order for screening mammogram, agreeable to influenza vaccine.  Plans to obtain Covid vaccine at the pharmacy. -Declined hep C and HIV screenings.    3) Weight:  Recommend to improve diet habits to improve overall feelings of well being and objective health data. Improve nutrient density of diet through increasing intake of fruits and vegetables and decreasing saturated fats, white flour products and refined sugars.  4) Healthcare Maintenance:  -Discussed with patient alcohol abuse including potential health complications and recommend to continue reducing consumption.  Provided information on outpatient rehab program.  Will continue to monitor liver function. -Patient has a history of gastric bypass and has not been taking vitamin B12 supplement  so advised to resume. -Patient is concerned about possible abdominal hernia so will place referral to general surgery.  Abdominal mass noted when patient in supine position with head raised suggesting diastasis recti, however, patient reports pain and increase in size so will defer additional evaluation to specialist. -Recommend to follow a heart healthy diet low in saturated and transfats.  Increase physical activity. -Continue Lexapro and hydroxyzine for anxiety. -Patient discontinued gabapentin because medication did not help with neuropathy.  Discussed alternative medications including Lyrica. Will continue to monitor symptoms. -Follow-up in 4 months for mood and labs (CBC and CMP).     No orders of the defined types were placed in this encounter.   Orders Placed This Encounter  Procedures  . MM Digital Screening  . Flu Vaccine QUAD 6+ mos PF IM (Fluarix Quad PF)  . Ambulatory referral to General Surgery     Return in about 4 months (around 05/21/2021) for MOOD with labs (CBC and CMP).    Gross side effects, risk and benefits, and alternatives of medications discussed with patient.  Patient is aware that all medications have potential side effects and we are unable to predict every side effect or drug-drug interaction that may occur.  Expresses verbal understanding and consents to current therapy plan and treatment regimen.  F-up preventative CPE in 1 year- this is in addition to any chronic care visits.    Please see orders placed and AVS handed out to patient at the end of our visit for further patient instructions/ counseling done pertaining to today's office visit.     Subjective:     CPE HPI: Brandi James is a 40 y.o. female who presents to [facility] today for a yearly health maintenance exam.   Health Maintenance Summary  - Reviewed and updated, unless pt declines services.  Last Cologuard or Colonoscopy:   N/A Family history of Colon  CA: N  Tobacco History Reviewed:  Y, current some smoker (usually when drinking) Alcohol and/or drug use:    Has decreased use from 3 wine bottles to 2 on daily basis. Exercise Habits:  No exercise routine Dental Home:N Eye exams:N Dermatology home:N  Female Health:  PAP Smear - last known results:  Pt deferred until next CPE. Currently started menses. STD concerns:   none Birth control method: currently not sexually active Menses regular:  Y Lumps or breast concerns:   none Breast Cancer Family History: No    Additional concerns beyond health maintenance issues: Abdominal lump on abdomen which has become painful and larger.     Immunization History  Administered Date(s) Administered  . Tdap 05/20/2011     Health Maintenance  Topic Date Due  . Hepatitis C Screening  Never done  . COVID-19 Vaccine (1) Never done  . HIV Screening  Never done  . PAP SMEAR-Modifier  Never done  . INFLUENZA VACCINE  Never done  . TETANUS/TDAP  05/19/2021     Wt Readings from Last 3 Encounters:  01/21/21 198 lb 4.8 oz (89.9 kg)  12/26/20 197 lb (89.4 kg)  02/06/19 193 lb 8 oz (87.8 kg)   BP Readings from Last 3 Encounters:  01/21/21 130/83  12/26/20 (!) 144/100  08/08/20 114/87   Pulse Readings from Last 3 Encounters:  01/21/21 92  12/26/20 94  08/08/20 93     Past Medical History:  Diagnosis Date  . Anxiety    [employer]rees[employer] 01/20/2021  . Depression   . Hypertension    [employer] 01/20/2021  . Panic attacks   . Panic disorder   . Substance abuse (HCC)    [employer]rees[employer] 01/20/2021      Past Surgical History:  Procedure Laterality Date  . abdominoplasty    . CHOLECYSTECTOMY    . COSMETIC SURGERY N/A    [employer] 01/20/2021  . GASTRIC BYPASS  2003      Family History  Problem Relation Age of Onset  . Healthy Mother   . Diabetes Father   . Hypertension Father       Social History   Substance and Sexual Activity  Drug Use No  ,   Social History   Substance  and Sexual Activity  Alcohol Use Yes  . Alcohol/week: 12.0 standard drinks  . Types: 12 Glasses of wine per week   Comment: 2 bottles of wine daily *  ,   Social History   Tobacco Use  Smoking Status Current Some Day Smoker  . Packs/day: 0.50  . Types: Cigarettes  Smokeless Tobacco Never Used  ,   Social History   Substance and Sexual Activity  Sexual Activity Not Currently  . Birth control/protection: None    Current Outpatient Medications on File Prior to Visit  Medication Sig Dispense Refill  . escitalopram (LEXAPRO) 5 MG tablet Take 1 tablet (5 mg total) by mouth daily. 30 tablet 0  . hydrOXYzine (ATARAX/VISTARIL) 25 MG tablet Take 1 tablet (25 mg total) by mouth every 8 (eight) hours as needed for anxiety. 60 tablet 0  . [DISCONTINUED] gabapentin (NEURONTIN) 100 MG capsule Take 1 capsule (100 mg total) by mouth 3 (three) times  daily. Start one capsule at night, then may add one capsule at dinner, then may add one capsule at breakfast 90 capsule 3  . [DISCONTINUED] propranolol (INDERAL) 10 MG tablet TAKE 1 TABLET BY MOUTH 2 TIMES DAILY. PATIENT NEEDS TELEMEDICINE VISIT PRIOR TO ANY FURTHER REFILLS (Patient not taking: No sig reported) 30 tablet 0   No current facility-administered medications on file prior to visit.    Allergies: Morphine and related  Review of Systems: General:   Denies fever, chills, unexplained weight loss.  Optho/Auditory:   Denies visual changes, blurred vision/LOV Respiratory:   Denies SOB, DOE more than baseline levels.   Cardiovascular:   Denies chest pain, palpitations, new onset peripheral edema  Gastrointestinal:   Denies nausea, vomiting, diarrhea.  Genitourinary: Denies dysuria, freq/ urgency, flank pain or discharge from genitals.  Endocrine:     Denies hot or cold intolerance, polyuria, polydipsia. Musculoskeletal:   Denies unexplained myalgias, joint swelling, unexplained arthralgias, gait problems.  Skin:  Denies rash, suspicious  lesions Neurological:     Denies dizziness, unexplained weakness, +foot neuropathy   Psychiatric/Behavioral:   Denies mood changes, suicidal or homicidal ideations, hallucinations    Objective:    Blood pressure 130/83, pulse 92, temperature 99.1 F (37.3 C), height 5\' 5"  (1.651 m), weight 198 lb 4.8 oz (89.9 kg), last menstrual period 01/21/2021, SpO2 96 %. Body mass index is 33 kg/m. General Appearance:    Alert, cooperative, no distress, appears stated age  Head:    Normocephalic, without obvious abnormality, atraumatic  Eyes:    PERRL, conjunctiva/corneas clear, EOM's intact, both eyes  Ears:    Normal TM's and external ear canals, both ears  Nose:   Nares normal, septum midline, mucosa normal, no drainage    or sinus tenderness  Throat:   Lips w/o lesion, mucosa moist, and tongue normal; teeth and   gums fair  Neck:   Supple, symmetrical, trachea midline, no adenopathy;    thyroid:  no enlargement/tenderness/nodules; no carotid   bruit or JVD  Back:     Symmetric, no curvature, ROM normal, no CVA tenderness  Lungs:     Clear to auscultation bilaterally, respirations unlabored, no       Wh/ R/ R  Chest Wall:    No tenderness or gross deformity; normal excursion   Heart:    Regular rate and rhythm, S1 and S2 normal, no murmur, rub   or gallop  Breast Exam:    No tenderness, masses, or nipple abnormality b/l; no d/c; dense breast tissue noted  Abdomen:     Soft, non-tender, bowel sounds active all four quadrants, No   G/R/R, + mass, no organomegaly  Genitalia:   Deferred by pt. On menses.  Rectal:   Deferred by pt. On menses.  Extremities:   Extremities normal, atraumatic, no cyanosis or gross edema  Pulses:   2+ and symmetric all extremities  Skin:   Warm, dry, Skin color, texture, turgor normal, no obvious rashes or lesions Psych: No HI/SI, judgement and insight good, anxious mood. Full Affect.  Neurologic:   CNII-XII grossly intact, normal strength, sensation and reflexes  throughout

## 2021-01-21 NOTE — Patient Instructions (Addendum)
Social Anxiety Disorder, Adult Social anxiety disorder (SAD), previously called social phobia, is a mental health condition. People with SAD often feel nervous, afraid, or embarrassed when they are around other people in social situations. They worry that other people are judging or criticizing them for how they look, what they say, or how they act. SAD involves more than just feeling shy or self-conscious at times. It can cause severe emotional distress. It can interfere with activities of daily life. SAD also may lead to alcohol or drug use, and even suicide. SAD is a common mental health condition. It can develop at any time, but it usually starts in the teenage years. What are the causes? The cause of this condition is not known. It may involve genes that are passed through families. Stressful events may trigger anxiety. This disorder is also associated with an overactive amygdala. The amygdala is the part of the brain that triggers your response to strong feelings, such as fear. What increases the risk? This condition is more likely to develop in:  People who have a family history of anxiety disorders.  Women.  People who have a physical or behavioral condition that makes them feel self-conscious or nervous, such as a stutter or a long-term (chronic) disease. What are the signs or symptoms? The main symptom of this condition is fear of embarrassment caused by being criticized or judged in social situations. You may be afraid to:  Speak in public.  Go shopping.  Use a public bathroom.  Eat at a restaurant.  Go to work.  Interact with people you do not know. Extreme fear and anxiety may cause physical symptoms, including:  Blushing.  A fast heartbeat.  Sweating.  Shaky hands or voice.  Confusion.  Light-headedness.  Upset stomach, diarrhea, or vomiting.  Shortness of breath. How is this diagnosed? This condition is diagnosed based on your history, symptoms, and  behavior in social situations. You may be diagnosed with this type of anxiety if your symptoms have lasted for more than 6 months and have been present on more days than not. Your health care provider may ask you about your use of alcohol, drugs, and prescription medicines. He or she may also refer you to a mental health specialist for further evaluation or treatment. How is this treated? Treatment for this condition may include:  Cognitive behavioral therapy (CBT). This type of talk therapy helps you learn to replace negative thoughts and behaviors with positive ones. This may include learning how to use self-calming skills and other methods of managing your anxiety.  Exposure therapy. You will be exposed to social situations that cause you fear. The treatment starts with practicing self-calming in situations that cause you low levels of fear. Over time, you will progress by sustaining self-calming and managing harder situations.  Antidepressant medicines. These medicines may be used by themselves or in addition to other therapies.  Biofeedback. This process trains you to manage your body's response (physiological response) through breathing techniques and relaxation methods. You will work with a therapist while machines are used to monitor your physical symptoms.  Techniques for relaxation and managing anxiety. These include deep breathing, self-talk, meditation, visual imagery, muscle relaxation, music therapy, and yoga. These techniques are often used with other therapies to keep you calm in situations that cause you anxiety. These treatments are often used in combination.   Follow these instructions at home: Alcohol use If you drink alcohol:  Limit how much you use to: ? 0-1 drink a  day for nonpregnant women. ? 0-2 drinks a day for men.  Be aware of how much alcohol is in your drink. In the [place] one drink equals one 12 oz bottle of beer (355 mL), one 5 oz glass of wine (148 mL), or one  1 oz glass of hard liquor (44 mL). General instructions  Take over-the-counter and prescription medicines only as told by your health care provider.  Practice techniques for relaxation and managing anxiety at times you are not challenged by social anxiety.  Return to social activities using techniques you have learned, as you feel ready to do so.  Avoid caffeine and certain over-the-counter cold medicines. These may make you feel worse. Ask your pharmacists which medicines to avoid.  Keep all follow-up visits as told by your health care provider. This is important. Where to find more information  [employer] on Mental Illness (NAMI): https://www.nami.org  Social Anxiety Association: https://socialphobia.[facility]): PipeCollectors.no  Anxiety and Depression Association of [place] (ADAA): FeeTelevision.cz Contact a health care provider if:  Your symptoms do not improve or get worse.  You have signs of depression, such as: ? Persistent sadness or moodiness. ? Loss of enjoyment in activities that used to bring you joy. ? Change in weight or eating. ? Changes in sleeping habits. ? Avoiding friends or family members more than usual. ? Loss of energy for normal tasks. ? Feeling guilty or worthless.  You become more isolated than you normally are.  You find it more and more difficult to speak or interact with others.  You are using drugs.  You are drinking more alcohol than usual. Get help right away if:  You harm yourself.  You have suicidal thoughts. If you ever feel like you may hurt yourself or others, or have thoughts about taking your own life, get help right away. You can go to your nearest emergency department or call:  Your local emergency services (911 in the [place]  A suicide crisis helpline, such as the [place] at 743-422-4281. This is open 24 hours a day. Summary  Social anxiety disorder  (SAD) may cause you to feel nervous, afraid, or embarrassed when you are around other people in social situations.  SAD is a common mental disorder. It can develop at any time, but it usually starts in the teenage years.  Treatment includes talk therapy, exposure therapy, medicines, biofeedback, and relaxation techniques. It can involve a combination of treatments. This information is not intended to replace advice given to you by your health care provider. Make sure you discuss any questions you have with your health care provider. Document Revised: 05/10/2019 Document Reviewed: 05/10/2019 Elsevier Patient Education  2021 Elsevier Inc.  Preventive Care 56-43 Years Old, Female Preventive care refers to lifestyle choices and visits with your health care provider that can promote health and wellness. This includes:  A yearly physical exam. This is also called an annual wellness visit.  Regular dental and eye exams.  Immunizations.  Screening for certain conditions.  Healthy lifestyle choices, such as: ? Eating a healthy diet. ? Getting regular exercise. ? Not using drugs or products that contain nicotine and tobacco. ? Limiting alcohol use. What can I expect for my preventive care visit? Physical exam Your health care provider will check your:  Height and weight. These may be used to calculate your BMI (body mass index). BMI is a measurement that tells if you are at a healthy weight.  Heart rate and  blood pressure.  Body temperature.  Skin for abnormal spots. Counseling Your health care provider may ask you questions about your:  Past medical problems.  Family's medical history.  Alcohol, tobacco, and drug use.  Emotional well-being.  Home life and relationship well-being.  Sexual activity.  Diet, exercise, and sleep habits.  Work and work Statistician.  Access to firearms.  Method of birth control.  Menstrual cycle.  Pregnancy history. What immunizations  do I need? Vaccines are usually given at various ages, according to a schedule. Your health care provider will recommend vaccines for you based on your age, medical history, and lifestyle or other factors, such as travel or where you work.   What tests do I need? Blood tests  Lipid and cholesterol levels. These may be checked every 5 years, or more often if you are over 66 years old.  Hepatitis C test.  Hepatitis B test. Screening  Lung cancer screening. You may have this screening every year starting at age 75 if you have a 30-pack-year history of smoking and currently smoke or have quit within the past 15 years.  Colorectal cancer screening. ? All adults should have this screening starting at age 42 and continuing until age 61. ? Your health care provider may recommend screening at age 19 if you are at increased risk. ? You will have tests every 1-10 years, depending on your results and the type of screening test.  Diabetes screening. ? This is done by checking your blood sugar (glucose) after you have not eaten for a while (fasting). ? You may have this done every 1-3 years.  Mammogram. ? This may be done every 1-2 years. ? Talk with your health care provider about when you should start having regular mammograms. This may depend on whether you have a family history of breast cancer.  BRCA-related cancer screening. This may be done if you have a family history of breast, ovarian, tubal, or peritoneal cancers.  Pelvic exam and Pap test. ? This may be done every 3 years starting at age 5. ? Starting at age 48, this may be done every 5 years if you have a Pap test in combination with an HPV test. Other tests  STD (sexually transmitted disease) testing, if you are at risk.  Bone density scan. This is done to screen for osteoporosis. You may have this scan if you are at high risk for osteoporosis. Talk with your health care provider about your test results, treatment options, and  if necessary, the need for more tests. Follow these instructions at home: Eating and drinking  Eat a diet that includes fresh fruits and vegetables, whole grains, lean protein, and low-fat dairy products.  Take vitamin and mineral supplements as recommended by your health care provider.  Do not drink alcohol if: ? Your health care provider tells you not to drink. ? You are pregnant, may be pregnant, or are planning to become pregnant.  If you drink alcohol: ? Limit how much you have to 0-1 drink a day. ? Be aware of how much alcohol is in your drink. In the [place] one drink equals one 12 oz bottle of beer (355 mL), one 5 oz glass of wine (148 mL), or one 1 oz glass of hard liquor (44 mL).   Lifestyle  Take daily care of your teeth and gums. Brush your teeth every morning and night with fluoride toothpaste. Floss one time each day.  Stay active. Exercise for at least 30 minutes 5  or more days each week.  Do not use any products that contain nicotine or tobacco, such as cigarettes, e-cigarettes, and chewing tobacco. If you need help quitting, ask your health care provider.  Do not use drugs.  If you are sexually active, practice safe sex. Use a condom or other form of protection to prevent STIs (sexually transmitted infections).  If you do not wish to become pregnant, use a form of birth control. If you plan to become pregnant, see your health care provider for a prepregnancy visit.  If told by your health care provider, take low-dose aspirin daily starting at age 30.  Find healthy ways to cope with stress, such as: ? Meditation, yoga, or listening to music. ? Journaling. ? Talking to a trusted person. ? Spending time with friends and family. Safety  Always wear your seat belt while driving or riding in a vehicle.  Do not drive: ? If you have been drinking alcohol. Do not ride with someone who has been drinking. ? When you are tired or distracted. ? While texting.  Wear a  helmet and other protective equipment during sports activities.  If you have firearms in your house, make sure you follow all gun safety procedures. What's next?  Visit your health care provider once a year for an annual wellness visit.  Ask your health care provider how often you should have your eyes and teeth checked.  Stay up to date on all vaccines. This information is not intended to replace advice given to you by your health care provider. Make sure you discuss any questions you have with your health care provider. Document Revised: 09/10/2020 Document Reviewed: 08/18/2018 Elsevier Patient Education  2021 [employer].

## 2021-02-04 ENCOUNTER — Ambulatory Visit: Payer: 59 | Admitting: General Surgery

## 2021-02-12 ENCOUNTER — Telehealth: Payer: Self-pay | Admitting: Physician Assistant

## 2021-02-12 NOTE — Telephone Encounter (Signed)
error 

## 2021-02-12 NOTE — Telephone Encounter (Signed)
For what issue? 

## 2021-02-12 NOTE — Telephone Encounter (Signed)
A hernia. She would like one in [place] and preferably at [facility].

## 2021-02-12 NOTE — Telephone Encounter (Signed)
Patient would like a referral to [facility] in [place]. The previous referral was in [place]. Thanks

## 2021-02-26 ENCOUNTER — Telehealth: Payer: Self-pay | Admitting: Physician Assistant

## 2021-02-26 DIAGNOSIS — F411 Generalized anxiety disorder: Secondary | ICD-10-CM

## 2021-02-26 DIAGNOSIS — R1900 Intra-abdominal and pelvic swelling, mass and lump, unspecified site: Secondary | ICD-10-CM

## 2021-02-26 MED ORDER — ESCITALOPRAM OXALATE 5 MG PO TABS: 5.0000 mg | ORAL_TABLET | Freq: Every day | ORAL | 0 refills | Status: DC

## 2021-02-26 NOTE — Addendum Note (Signed)
Addended by: Sylvester Harder on: 02/26/2021 10:06 AM   Modules accepted: Orders

## 2021-02-26 NOTE — Telephone Encounter (Signed)
Patient would like a referral to [place] [facility] for a hernia. Patient has not heard anything yet. Please advise. Thanks

## 2021-02-26 NOTE — Telephone Encounter (Signed)
Per Kandis Cocking med refill sent to pharmacy. AS, CMA

## 2021-02-26 NOTE — Telephone Encounter (Signed)
Referral Referral # [id number]  Referral Information  Referral # Creation Date Referral Status Status Update   [id number] 01/21/2021 Authorized 01/28/2021: Status History   Status Reason Referral Type Referral Reasons Referral Class  No Approval Necessary - Patient Tracking Surgical Specialty Services Required Internal   To Specialty To Provider To Location/Place of Service To Department  General Surgery none none [facility]   To Vendor Referred By By Location/Place of Service By Department  none Mayer Masker, PA-C [facility]   Priority Start Date Expiration Date Referral Entered By  Routine 01/21/2021 01/21/2022 Braxdon Gappa, Laren Everts, CMA   Visits Requested Visits Authorized Visits Completed Visits Scheduled  1 1  0    Procedure Information  Service Details Procedure Modifiers Provider Requested Approved  [id number] - AMB REFERRAL TO GENERAL SURGERY None  1 1   Diagnosis Information  Diagnosis  R19.00 (ICD-10-CM) - Abdominal mass, unspecified abdominal location    Referral Notes Number of Notes: 3  . Type Date User Summary Attachment  General 02/11/2021  1:27 PM Erich Montane, CMA - -  Note   Patient is unsure of where she wants to go, I will close this referral for now, but she is more than welcome to call our office to schedule if she decides she wants to be seen here         . Type Date User Summary Attachment  General 02/07/2021  9:07 AM Armond Hang D - -  Note   Patient called and would like a provider closer to home- patient is researching and will call office back to update         . Type Date User Summary Attachment  General 02/03/2021 10:49 AM Erich Montane, CMA - -  Note   Patient is going to call back to r/s

## 2021-02-26 NOTE — Telephone Encounter (Signed)
Patient states she was prescribed generic Lexapro by a different provider for thirty days. Patient took it for anxiety. She is wondering can she get a refill on it. Please advise, thanks.

## 2021-02-26 NOTE — Addendum Note (Signed)
Addended by: Sylvester Harder on: 02/26/2021 10:17 AM   Modules accepted: Orders

## 2021-02-26 NOTE — Telephone Encounter (Signed)
Pt was previously referred to [facility]. Reordering referral to requested location. AS, CMA

## 2021-03-24 ENCOUNTER — Encounter: Payer: Self-pay | Admitting: Surgery

## 2021-03-24 ENCOUNTER — Ambulatory Visit: Payer: Self-pay | Admitting: Surgery

## 2021-03-24 NOTE — H&P (Signed)
Brandi James Appointment: 03/24/2021 3:15 PM Location: [facility] Patient #: [id number] DOB: 28-Sep-1981 Single / Language: [place] / Race: White Female  History of Present Illness Brandi James(Brandi James C. Brandi Eskridge MD; 03/24/2021 4:57 PM) The patient is a 40 year old female who presents with an incisional hernia. Note for "Incisional hernia": ` ` ` Patient sent for surgical consultation at the request of Mayer MaskerMaritza Abonza, PA  Chief Complaint: Painful hernia. ` ` The patient is a on status post gastric bypass surgery in 200? [facility] [employer]. Abdominoplasty a year later. Had cholecystectomy 2009 for cholecystitis by Dr. Zachery DakinsWeatherly with our group. Since retired. Patient noted some discomfort in her abdomen. Hernia suspected. Worsening symptoms. Requested surgical evaluation. Patient comes today by herself. She thinks she is noted bulging above her bellybutton for the past year and a half. Usually it has not been that bad her bothersome. However its become more sensitive. She feels like its out all the time now. Discussed with primary care. I offered surgical evaluation. Patient does smoke. She is working to quitting. She can walk at least 20 minutes before she has to stop. She is a recovering alcoholic and has struggled with some depression issues. However she seems to be good spirits today. No sleep apnea. No diabetes. She usually moves her bowels every morning. She occasionally has some irregular bowels and has hemorrhoids. Was constipated but seems to be better on fiber regimen now. Had been on vitamins and a while. Was found to be vitamin D deficient. I believe vitamin B12. Now on supplements. Feeling better.   (Review of systems as stated in this history (HPI) or in the review of systems. Otherwise all other 12 point ROS are negative) ` ` ###########################################`  This patient encounter took 30 minutes today to perform the following: obtain history,  perform exam, review outside records, interpret tests & imaging, counsel the patient on their diagnosis; and, document this encounter, including findings & plan in the electronic health record (EHR).   Past Surgical History Marliss Coots(Donyelle Alston, CNA; 03/24/2021 3:01 PM) Gallbladder Surgery - Laparoscopic Gastric Bypass  Diagnostic Studies History Marliss Coots(Donyelle Alston, CNA; 03/24/2021 3:01 PM) Colonoscopy never Mammogram never Pap Smear 1-5 years ago  Allergies Marliss Coots(Donyelle Alston, CNA; 03/24/2021 3:02 PM) No Known Drug Allergies [03/24/2021]: Allergies Reconciled  Medication History Marliss Coots(Donyelle Alston, CNA; 03/24/2021 3:05 PM) Escitalopram &Methfol-B6-B12-D (10MG  Therapy Pack, Oral) Active. Atarax (10MG /5ML Syrup, Oral) Active. clonazePAM (0.125MG  Tablet Disint, Oral) Active. Sertraline HCl (20MG /ML Concentrate, Oral) Active. Escitalopram Oxalate (5MG /5ML Solution, Oral) Active. Mirena (52 MG) (20MCG/24HR IUD, Intrauterine) Active. Septra (400-80MG  Tablet, Oral) Active. Xanax (0.25MG  Tablet, Oral) Active. Medications Reconciled  Social History Marliss Coots(Donyelle Alston, CNA; 03/24/2021 3:01 PM) Alcohol use Heavy alcohol use. Caffeine use Carbonated beverages. No drug use Tobacco use Current some day smoker.  Family History Marliss Coots(Donyelle Alston, CNA; 03/24/2021 3:01 PM) Diabetes Mellitus Father. Hypertension Father. Thyroid problems Mother.  Pregnancy / Birth History Marliss Coots(Donyelle Alston, CNA; 03/24/2021 3:01 PM) Age at menarche 11 years. Contraceptive History Intrauterine device. Gravida 1 Irregular periods Length (months) of breastfeeding 3-6 Maternal age 42-30 Para 1  Other Problems Marliss Coots(Donyelle Alston, CNA; 03/24/2021 3:01 PM) Cholelithiasis Depression Hemorrhoids     Review of Systems Marliss Coots(Donyelle Alston CNA; 03/24/2021 3:01 PM) General Present- Fatigue. Not Present- Appetite Loss, Chills, Fever, Night Sweats, Weight Gain and Weight Loss. Skin Not Present- Change in Wart/Mole,  Dryness, Hives, Jaundice, New Lesions, Non-Healing Wounds, Rash and Ulcer. HEENT Present- Seasonal Allergies. Not Present- Earache, Hearing Loss, Hoarseness, Nose Bleed, Oral Ulcers,  Ringing in the Ears, Sinus Pain, Sore Throat, Visual Disturbances, Wears glasses/contact lenses and Yellow Eyes. Respiratory Present- Snoring. Not Present- Bloody sputum, Chronic Cough, Difficulty Breathing and Wheezing. Breast Not Present- Breast Mass, Breast Pain, Nipple Discharge and Skin Changes. Cardiovascular Present- Shortness of Breath. Not Present- Chest Pain, Difficulty Breathing Lying Down, Leg Cramps, Palpitations, Rapid Heart Rate and Swelling of Extremities. Gastrointestinal Present- Bloating and Hemorrhoids. Not Present- Abdominal Pain, Bloody Stool, Change in Bowel Habits, Chronic diarrhea, Constipation, Difficulty Swallowing, Excessive gas, Gets full quickly at meals, Indigestion, Nausea, Rectal Pain and Vomiting. Female Genitourinary Not Present- Frequency, Nocturia, Painful Urination, Pelvic Pain and Urgency. Musculoskeletal Not Present- Back Pain, Joint Pain, Joint Stiffness, Muscle Pain, Muscle Weakness and Swelling of Extremities. Neurological Present- Numbness and Tingling. Not Present- Decreased Memory, Fainting, Headaches, Seizures, Tremor, Trouble walking and Weakness. Psychiatric Present- Anxiety and Change in Sleep Pattern. Not Present- Bipolar, Depression, Fearful and Frequent crying. Endocrine Present- Hot flashes. Not Present- Cold Intolerance, Excessive Hunger, Hair Changes, Heat Intolerance and New Diabetes. Hematology Not Present- Blood Thinners, Easy Bruising, Excessive bleeding, Gland problems, HIV and Persistent Infections.  Vitals (Donyelle Alston CNA; 03/24/2021 3:05 PM) 03/24/2021 3:05 PM Weight: 193.5 lb Height: 65in Body Surface Area: 1.95 m Body Mass Index: 32.2 kg/m  Temp.: 98.60F  Pulse: 116 (Regular)  P.OX: 96% (Room air) BP: 136/80(Sitting, Left Arm,  Standard)        Physical Exam Brandi Sportsman MD; 03/24/2021 3:41 PM)  General Mental Status-Alert. General Appearance-Not in acute distress, Not Sickly. Orientation-Oriented X3. Hydration-Well hydrated. Voice-Normal.  Integumentary Global Assessment Upon inspection and palpation of skin surfaces of the - Axillae: non-tender, no inflammation or ulceration, no drainage. and Distribution of scalp and body hair is normal. General Characteristics Temperature - normal warmth is noted.  Head and Neck Head-normocephalic, atraumatic with no lesions or palpable masses. Face Global Assessment - atraumatic, no absence of expression. Neck Global Assessment - no abnormal movements, no bruit auscultated on the right, no bruit auscultated on the left, no decreased range of motion, non-tender. Trachea-midline. Thyroid Gland Characteristics - non-tender.  Eye Eyeball - Left-Extraocular movements intact, No Nystagmus - Left. Eyeball - Right-Extraocular movements intact, No Nystagmus - Right. Cornea - Left-No Hazy - Left. Cornea - Right-No Hazy - Right. Sclera/Conjunctiva - Left-No scleral icterus, No Discharge - Left. Sclera/Conjunctiva - Right-No scleral icterus, No Discharge - Right. Pupil - Left-Direct reaction to light normal. Pupil - Right-Direct reaction to light normal.  ENMT Ears Pinna - Left - no drainage observed, no generalized tenderness observed. Pinna - Right - no drainage observed, no generalized tenderness observed. Nose and Sinuses External Inspection of the Nose - no destructive lesion observed. Inspection of the nares - Left - quiet respiration. Inspection of the nares - Right - quiet respiration. Mouth and Throat Lips - Upper Lip - no fissures observed, no pallor noted. Lower Lip - no fissures observed, no pallor noted. Nasopharynx - no discharge present. Oral Cavity/Oropharynx - Tongue - no dryness observed. Oral Mucosa - no cyanosis  observed. Hypopharynx - no evidence of airway distress observed.  Chest and Lung Exam Inspection Movements - Normal and Symmetrical. Accessory muscles - No use of accessory muscles in breathing. Palpation Palpation of the chest reveals - Non-tender. Auscultation Breath sounds - Normal and Clear.  Cardiovascular Auscultation Rhythm - Regular. Murmurs & Other Heart Sounds - Auscultation of the heart reveals - No Murmurs and No Systolic Clicks.  Abdomen Inspection Inspection of the abdomen reveals - No Visible  peristalsis and No Abnormal pulsations. Umbilicus - No Bleeding, No Urine drainage. Palpation/Percussion Palpation and Percussion of the abdomen reveal - Soft, Non Tender, No Rebound tenderness, No Rigidity (guarding) and No Cutaneous hyperesthesia. Note: Abdomen obese but soft. Moderate diastases recti. I do feel some fullness about 5 cm suprapubically to the left concerning for incarcerated hernia. Also hernia at the umbilicus in an old incision. Sensitive in the region. No guarding.  Female Genitourinary Note: Nontender. No inguinal hernias. No inguinal lymphadenopathy. No major vaginal bleeding nor foul discharge  Peripheral Vascular Upper Extremity Inspection - Left - No Cyanotic nailbeds - Left, Not Ischemic. Inspection - Right - No Cyanotic nailbeds - Right, Not Ischemic.  Neurologic Neurologic evaluation reveals -normal attention span and ability to concentrate, able to name objects and repeat phrases. Appropriate fund of knowledge , normal sensation and normal coordination. Mental Status Affect - not angry, not paranoid. Cranial Nerves-Normal Bilaterally. Gait-Normal.  Neuropsychiatric Mental status exam performed with findings of-able to articulate well with normal speech/language, rate, volume and coherence, thought content normal with ability to perform basic computations and apply abstract reasoning and no evidence of hallucinations, delusions, obsessions  or homicidal/suicidal ideation.  Musculoskeletal Global Assessment Spine, Ribs and Pelvis - no instability, subluxation or laxity. Right Upper Extremity - no instability, subluxation or laxity.  Lymphatic Head & Neck  General Head & Neck Lymphatics: Bilateral - Description - No Localized lymphadenopathy. Axillary  General Axillary Region: Bilateral - Description - No Localized lymphadenopathy. Femoral & Inguinal  Generalized Femoral & Inguinal Lymphatics: Left - Description - No Localized lymphadenopathy. Right - Description - No Localized lymphadenopathy.    Assessment & Plan Brandi Sportsman MD; 03/24/2021 4:59 PM)  EPIGASTRIC HERNIA (K43.9) Impression: Supraumbilical subcutaneous mass suspicious for incarcerated hernia given sensitivity.  Umbilical hernia as well. I think she benefit from laparoscopic exploration and repair of hernias with mesh.  I strongly recommend she quit smoking to lower the chance of recurrence and/or infection.   INCARCERATED INCISIONAL HERNIA (K43.0) Impression: Bulging supraumbilically suspicious for super umbilical hernia. Also on the study and diastases recti. Periumbilical incisional hernia as well.  I think she would benefit from diagnostic laparoscopy and repair of hernias found. Moderate use of mesh given her diastases recti and obesity and smoking.  I strongly encouraged her to quit smoking. She is planning to do that.  She is leaning toward surgery. She is hoping to do it in the summertime when she is not working. She will call help coordinate a convenient time.   PREOP - [facility] - ENCOUNTER FOR PREOPERATIVE EXAMINATION FOR GENERAL SURGICAL PROCEDURE (Z01.818)  Current Plans You are being scheduled for surgery- Our schedulers will call you.  You should hear from our office's scheduling department within 5 working days about the location, date, and time of surgery. We try to make accommodations for patient's preferences in scheduling  surgery, but sometimes the OR schedule or the surgeon's schedule prevents [place] from making those accommodations.  If you have not heard from our office [phone]) in 5 working days, call the office and ask for your surgeon's nurse.  If you have other questions about your diagnosis, plan, or surgery, call the office and ask for your surgeon's nurse.  Written instructions provided [facility] Consent - Hernia Repair - Ventral/Incisional/Umbilical (Daleiza Bacchi): discussed with patient and provided information. Pt Education - [facility] Hernia Post-Op HCI (Shavaughn Seidl): discussed with patient and provided information. Pt Education - [facility] Pain Control (Eveny Anastas) Pt Education - Pamphlet Given - Laparoscopic Hernia  Repair: discussed with patient and provided information. Pt Education - [facility] Mesh education: discussed with patient and provided information.  TOBACCO ABUSE (Z72.0) Impression: STOP SMOKING!  We talked to the patient about the dangers of smoking. We stressed that tobacco use dramatically increases the risk of peri-operative complications such as infection, tissue necrosis leaving to problems with incision/wound and organ healing, hernia, chronic pain, heart attack, stroke, DVT, pulmonary embolism, and death. We noted there are programs in our community to help stop smoking. Information was available.  Current Plans Pt Education - [facility] STOP SMOKING!  BODY MASS INDEX [BMI] 30.0-30.9, ADULT (Z68.30) Impression: Increase the risk of hernia recurrence. Hopefully can keep weight down if possible.  History gastric bypass surgery.   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 Records not available.  She is back on vitamin supplementation given her vitamin B-12 deficiency.   DIASTASIS RECTI (M62.08)  Current Plans Pt Education - [facility] Diastasis Recti: discussed with patient and provided information.  Brandi Sportsman, MD, FACS,  MASCRS  Gastrointestinal and Minimally Invasive Surgery  [facility] [address] [place], [place] [address] [phone] Fax [phone] Main/Paging  CONTACT INFORMATION: Weekday (9AM-5PM) concerns: Call [facility] main office at [phone] Weeknight (5PM-9AM) or Weekend/Holiday concerns: Check [email or web address] for General Surgery [facility] coverage (Please, do not use SecureChat as it is not reliable communication to operating surgeons for immediate patient care)

## 2021-05-06 ENCOUNTER — Telehealth: Payer: Self-pay | Admitting: Physician Assistant

## 2021-05-06 NOTE — Telephone Encounter (Signed)
Patient called, concerned with her liver enzymes, patient indicated she is very dehydrated. Patient is scheduled 05/21/21, she asked if she could be scheduled sooner, I advised that there are no available appointments prior to that date.

## 2021-05-07 ENCOUNTER — Other Ambulatory Visit: Payer: Self-pay

## 2021-05-07 ENCOUNTER — Ambulatory Visit
Admission: RE | Admit: 2021-05-07 | Discharge: 2021-05-07 | Disposition: A | Discharge status: Home / Self Care | Payer: 59 | Source: Ambulatory Visit | Attending: Emergency Medicine | Admitting: Emergency Medicine

## 2021-05-07 VITALS — BP 130/84 | HR 113 | Temp 98.9°F | Resp 18

## 2021-05-07 DIAGNOSIS — R140 Abdominal distension (gaseous): Secondary | ICD-10-CM | POA: Diagnosis not present

## 2021-05-07 DIAGNOSIS — F1010 Alcohol abuse, uncomplicated: Secondary | ICD-10-CM | POA: Diagnosis not present

## 2021-05-07 DIAGNOSIS — R17 Unspecified jaundice: Secondary | ICD-10-CM

## 2021-05-07 LAB — POCT URINALYSIS DIP (MANUAL ENTRY)
Blood, UA: NEGATIVE
Glucose, UA: NEGATIVE mg/dL
Ketones, POC UA: NEGATIVE mg/dL
Leukocytes, UA: NEGATIVE
Nitrite, UA: NEGATIVE
Protein Ur, POC: NEGATIVE mg/dL
Spec Grav, UA: <=1.005 — AB (ref 1.010–1.025)
Urobilinogen, UA: 2 E.U./dL — AB
pH, UA: 5.5 (ref 5.0–8.0)

## 2021-05-07 LAB — CBC WITH DIFFERENTIAL/PLATELET
Basophils Absolute: 0 10*3/uL (ref 0.0–0.2)
Basos: 0 %
EOS (ABSOLUTE): 0 10*3/uL (ref 0.0–0.4)
Eos: 0 %
Hematocrit: 29 % — ABNORMAL LOW (ref 34.0–46.6)
Hemoglobin: 10.5 g/dL — ABNORMAL LOW (ref 11.1–15.9)
Lymphocytes Absolute: 1.5 10*3/uL (ref 0.7–3.1)
Lymphs: 16 %
MCH: 33.2 pg — ABNORMAL HIGH (ref 26.6–33.0)
MCHC: 36.2 g/dL — ABNORMAL HIGH (ref 31.5–35.7)
MCV: 92 fL (ref 79–97)
Monocytes Absolute: 1.4 10*3/uL — ABNORMAL HIGH (ref 0.1–0.9)
Monocytes: 15 %
Neutrophils Absolute: 6.6 10*3/uL (ref 1.4–7.0)
Neutrophils: 69 %
Platelets: 196 10*3/uL (ref 150–450)
RBC: 3.16 x10E6/uL — ABNORMAL LOW (ref 3.77–5.28)
RDW: 20.1 % — ABNORMAL HIGH (ref 11.7–15.4)
WBC: 9.5 10*3/uL (ref 3.4–10.8)

## 2021-05-07 LAB — COMPREHENSIVE METABOLIC PANEL
ALT: 30 IU/L (ref 0–32)
AST: 158 IU/L — ABNORMAL HIGH (ref 0–40)
Albumin/Globulin Ratio: 0.8 — ABNORMAL LOW (ref 1.2–2.2)
Albumin: 3.1 g/dL — ABNORMAL LOW (ref 3.8–4.8)
Alkaline Phosphatase: 279 IU/L — ABNORMAL HIGH (ref 44–121)
BUN/Creatinine Ratio: 5 — ABNORMAL LOW (ref 9–23)
BUN: 2 mg/dL — ABNORMAL LOW (ref 6–24)
Bilirubin Total: 9.3 mg/dL — ABNORMAL HIGH (ref 0.0–1.2)
CO2: 25 mmol/L (ref 20–29)
Calcium: 8.5 mg/dL — ABNORMAL LOW (ref 8.7–10.2)
Chloride: 98 mmol/L (ref 96–106)
Creatinine, Ser: 0.42 mg/dL — ABNORMAL LOW (ref 0.57–1.00)
Globulin, Total: 4 g/dL (ref 1.5–4.5)
Glucose: 106 mg/dL — ABNORMAL HIGH (ref 65–99)
Potassium: 4.2 mmol/L (ref 3.5–5.2)
Sodium: 133 mmol/L — ABNORMAL LOW (ref 134–144)
Total Protein: 7.1 g/dL (ref 6.0–8.5)
eGFR: 125 mL/min/{1.73_m2} (ref >59)

## 2021-05-07 LAB — LIPASE: Lipase: 35 U/L (ref 14–72)

## 2021-05-07 NOTE — ED Provider Notes (Signed)
[facility]    CSN: [id number] Arrival date & time: 05/07/21  1132      History   Chief Complaint Chief Complaint  Patient presents with  . abdominal tightness    HPI Brandi James is a 40 y.o. female history of hypertension, presenting today for evaluation of GI problems.  Reports that over the past 6 months she has had progressively worsening abdominal tightness and distention.  She reports that she noticed over the past week increased yellowing of her eyes.  Reports history of alcohol abuse, has been trying to cut back of recently, has cut back to approximately half bottle of wine daily.  Reports prior liver enzymes elevated.  Had brief episode of left leg swelling which is since resolved.  Denies current leg pain.  Denies history of DVT/PE.  HPI  Past Medical History:  Diagnosis Date  . Anxiety    Phreesia 01/20/2021  . Depression   . Hypertension    Phreesia 01/20/2021  . Panic attacks   . Panic disorder   . Substance abuse (HCC)    Phreesia 01/20/2021    Patient Active Problem List   Diagnosis Date Noted  . Elevated TSH 01/24/2019  . HTN, goal below 130/80 01/23/2019  . Obesity (BMI 30.0-34.9) 09/08/2017  . Healthcare maintenance 09/08/2017  . Severe episode of recurrent major depressive disorder, without psychotic features (HCC)   . Severe major depression without psychotic features (HCC) 12/19/2015  . Generalized anxiety disorder 12/19/2015    Past Surgical History:  Procedure Laterality Date  . abdominoplasty    . COSMETIC SURGERY N/A    Phreesia 01/20/2021  . LAPAROSCOPIC CHOLECYSTECTOMY  2009  . LAPAROSCOPIC GASTRIC BYPASS N/A 2003   [facility].  Dr Adele Dan    OB History   No obstetric history on file.      Home Medications    Prior to Admission medications   Medication Sig Start Date End Date Taking? Authorizing Provider  escitalopram (LEXAPRO) 5 MG tablet Take 1 tablet (5 mg total) by mouth daily. 02/26/21 03/28/21 Yes Abonza,  Maritza, PA-C  hydrOXYzine (ATARAX/VISTARIL) 25 MG tablet Take 1 tablet (25 mg total) by mouth every 8 (eight) hours as needed for anxiety. 12/26/20  Yes Lamptey, Britta Mccreedy, MD  gabapentin (NEURONTIN) 100 MG capsule Take 1 capsule (100 mg total) by mouth 3 (three) times daily. Start one capsule at night, then may add one capsule at dinner, then may add one capsule at breakfast 04/15/20 12/26/20  Delories Heinz, DPM  propranolol (INDERAL) 10 MG tablet TAKE 1 TABLET BY MOUTH 2 TIMES DAILY. PATIENT NEEDS TELEMEDICINE VISIT PRIOR TO ANY FURTHER REFILLS Patient not taking: No sig reported 05/23/19 12/26/20  Julaine Fusi, NP    Family History Family History  Problem Relation Age of Onset  . Healthy Mother   . Diabetes Father   . Hypertension Father     Social History Social History   Tobacco Use  . Smoking status: Current Some Day Smoker    Packs/day: 0.50    Types: Cigarettes  . Smokeless tobacco: Never Used  Vaping Use  . Vaping Use: Never used  Substance Use Topics  . Alcohol use: Yes    Alcohol/week: 12.0 standard drinks    Types: 12 Glasses of wine per week    Comment: 2 bottles of wine daily *  . Drug use: No     Allergies   Nsaids and Morphine and related   Review of Systems Review of  Systems  Constitutional: Negative for activity change, appetite change, chills, fatigue and fever.  HENT: Negative for congestion, ear pain, rhinorrhea, sinus pressure, sore throat and trouble swallowing.   Eyes: Negative for discharge and redness.  Respiratory: Negative for cough, chest tightness and shortness of breath.   Cardiovascular: Negative for chest pain.  Gastrointestinal: Positive for abdominal pain. Negative for diarrhea, nausea and vomiting.  Musculoskeletal: Negative for myalgias.  Skin: Negative for rash.  Neurological: Negative for dizziness, light-headedness and headaches.     Physical Exam Triage Vital Signs ED Triage Vitals  Enc Vitals Group     BP      Pulse       Resp      Temp      Temp src      SpO2      Weight      Height      Head Circumference      Peak Flow      Pain Score      Pain Loc      Pain Edu?      Excl. in GC?    No data found.  Updated Vital Signs BP 130/84 (BP Location: Left Arm)   Pulse (!) 113   Temp 98.9 F (37.2 C) (Oral)   Resp 18   SpO2 95%   Visual Acuity Right Eye Distance:   Left Eye Distance:   Bilateral Distance:    Right Eye Near:   Left Eye Near:    Bilateral Near:     Physical Exam Vitals and nursing note reviewed.  Constitutional:      Appearance: She is well-developed.     Comments: No acute distress  HENT:     Head: Normocephalic and atraumatic.     Nose: Nose normal.  Eyes:     Conjunctiva/sclera: Conjunctivae normal.     Comments: Scleral icterus present  Cardiovascular:     Rate and Rhythm: Normal rate.  Pulmonary:     Effort: Pulmonary effort is normal. No respiratory distress.  Abdominal:     General: There is distension.     Comments: Abdomen soft, but distended, enlarged liver palpated in right upper quadrant  Musculoskeletal:        General: Normal range of motion.     Cervical back: Neck supple.  Skin:    General: Skin is warm and dry.     Comments: Periorbital areas appear yellow  Neurological:     Mental Status: She is alert and oriented to person, place, and time.      UC Treatments / Results  Labs (all labs ordered are listed, but only abnormal results are displayed) Labs Reviewed  POCT URINALYSIS DIP (MANUAL ENTRY) - Abnormal; Notable for the following components:      Result Value   Bilirubin, UA large (*)    Spec Grav, UA <=1.005 (*)    Urobilinogen, UA 2.0 (*)    All other components within normal limits  COMPREHENSIVE METABOLIC PANEL  CBC WITH DIFFERENTIAL/PLATELET  LIPASE    EKG   Radiology No results found.  Procedures Procedures (including critical care time)  Medications Ordered in UC Medications - No data to display  Initial  Impression / Assessment and Plan / UC Course  I have reviewed the triage vital signs and the nursing notes.  Pertinent labs & imaging results that were available during my care of the patient were reviewed by me and considered in my medical decision making (see chart for  details).     Abdominal pain, jaundice and bilirubin in urine, in setting of alcohol abuse concerning for cirrhosis with associated ascites to abdomen, discussed given symptoms recommended further evaluation in the emergency room.  Patient reports that she feels she is unable to go today and asks that she is able to go tomorrow, given symptoms have been going on for 6 months, discussed likely safe to go, but recommending further work-up rather than delaying for outpatient follow-up, patient verbalized understanding.  Opted to go and proceed with UA showing large bilirubin, blood work pending along with referral to GI placed.  Discussed with patient she may likely need to be further evaluated and treated in emergency room.   Discussed strict return precautions. Patient verbalized understanding and is agreeable with plan.  Final Clinical Impressions(s) / UC Diagnoses   Final diagnoses:  Jaundice  Abdominal distention  Alcohol abuse     Discharge Instructions     Blood work pending for now Please go to emergency room for further evaluation Referral for Gi follow up placed     ED Prescriptions    None     PDMP not reviewed this encounter.   Sharyon Cable Ritchie C, PA-C 05/07/21 1324

## 2021-05-07 NOTE — Discharge Instructions (Signed)
Blood work pending for now Please go to emergency room for further evaluation Referral for Gi follow up placed

## 2021-05-07 NOTE — ED Triage Notes (Signed)
Appointment

## 2021-05-07 NOTE — ED Triage Notes (Signed)
Patient states that she is having abdominal tightness that has worsened over the past 6 months.  Patient has noticed her eyes yellowing has worsened in the last 2 weeks.  Patient has a history of elevated liver enzymes and some left leg swelling.

## 2021-05-08 ENCOUNTER — Inpatient Hospital Stay (HOSPITAL_COMMUNITY)
Admission: EM | Admit: 2021-05-08 | Discharge: 2021-05-09 | DRG: 433 | Disposition: A | Discharge status: Home / Self Care | Payer: 59 | Attending: Internal Medicine | Admitting: Internal Medicine

## 2021-05-08 ENCOUNTER — Emergency Department (HOSPITAL_COMMUNITY): Payer: 59

## 2021-05-08 ENCOUNTER — Encounter (HOSPITAL_COMMUNITY): Payer: Self-pay

## 2021-05-08 DIAGNOSIS — D649 Anemia, unspecified: Secondary | ICD-10-CM | POA: Diagnosis present

## 2021-05-08 DIAGNOSIS — K7031 Alcoholic cirrhosis of liver with ascites: Principal | ICD-10-CM | POA: Diagnosis present

## 2021-05-08 DIAGNOSIS — I10 Essential (primary) hypertension: Secondary | ICD-10-CM | POA: Diagnosis present

## 2021-05-08 DIAGNOSIS — Z20822 Contact with and (suspected) exposure to covid-19: Secondary | ICD-10-CM | POA: Diagnosis present

## 2021-05-08 DIAGNOSIS — F32A Depression, unspecified: Secondary | ICD-10-CM | POA: Diagnosis present

## 2021-05-08 DIAGNOSIS — K760 Fatty (change of) liver, not elsewhere classified: Secondary | ICD-10-CM | POA: Diagnosis present

## 2021-05-08 DIAGNOSIS — Z885 Allergy status to narcotic agent status: Secondary | ICD-10-CM | POA: Diagnosis not present

## 2021-05-08 DIAGNOSIS — D689 Coagulation defect, unspecified: Secondary | ICD-10-CM | POA: Diagnosis present

## 2021-05-08 DIAGNOSIS — M7989 Other specified soft tissue disorders: Secondary | ICD-10-CM | POA: Diagnosis present

## 2021-05-08 DIAGNOSIS — K7290 Hepatic failure, unspecified without coma: Secondary | ICD-10-CM | POA: Diagnosis not present

## 2021-05-08 DIAGNOSIS — F1010 Alcohol abuse, uncomplicated: Secondary | ICD-10-CM | POA: Diagnosis not present

## 2021-05-08 DIAGNOSIS — R7401 Elevation of levels of liver transaminase levels: Secondary | ICD-10-CM

## 2021-05-08 DIAGNOSIS — E871 Hypo-osmolality and hyponatremia: Secondary | ICD-10-CM | POA: Diagnosis present

## 2021-05-08 DIAGNOSIS — Z6832 Body mass index (BMI) 32.0-32.9, adult: Secondary | ICD-10-CM

## 2021-05-08 DIAGNOSIS — Z9049 Acquired absence of other specified parts of digestive tract: Secondary | ICD-10-CM

## 2021-05-08 DIAGNOSIS — R1900 Intra-abdominal and pelvic swelling, mass and lump, unspecified site: Secondary | ICD-10-CM | POA: Diagnosis present

## 2021-05-08 DIAGNOSIS — F17210 Nicotine dependence, cigarettes, uncomplicated: Secondary | ICD-10-CM | POA: Diagnosis present

## 2021-05-08 DIAGNOSIS — E669 Obesity, unspecified: Secondary | ICD-10-CM | POA: Diagnosis present

## 2021-05-08 DIAGNOSIS — K7460 Unspecified cirrhosis of liver: Secondary | ICD-10-CM

## 2021-05-08 DIAGNOSIS — Z8249 Family history of ischemic heart disease and other diseases of the circulatory system: Secondary | ICD-10-CM

## 2021-05-08 DIAGNOSIS — Z79899 Other long term (current) drug therapy: Secondary | ICD-10-CM

## 2021-05-08 DIAGNOSIS — Z886 Allergy status to analgesic agent status: Secondary | ICD-10-CM | POA: Diagnosis not present

## 2021-05-08 DIAGNOSIS — Z9884 Bariatric surgery status: Secondary | ICD-10-CM

## 2021-05-08 HISTORY — DX: Other specified anxiety disorders: F41.8

## 2021-05-08 HISTORY — PX: IR PARACENTESIS: IMG2679

## 2021-05-08 LAB — BODY FLUID CELL COUNT WITH DIFFERENTIAL
Eos, Fluid: 0 %
Lymphs, Fluid: 50 %
Monocyte-Macrophage-Serous Fluid: 28 % — ABNORMAL LOW (ref 50–90)
Neutrophil Count, Fluid: 22 % (ref 0–25)
Total Nucleated Cell Count, Fluid: 260 cu mm (ref 0–1000)

## 2021-05-08 LAB — COMPREHENSIVE METABOLIC PANEL
ALT: 31 U/L (ref 0–44)
AST: 131 U/L — ABNORMAL HIGH (ref 15–41)
Albumin: 2.2 g/dL — ABNORMAL LOW (ref 3.5–5.0)
Alkaline Phosphatase: 191 U/L — ABNORMAL HIGH (ref 38–126)
Anion gap: 7 (ref 5–15)
BUN: <5 mg/dL — ABNORMAL LOW (ref 6–20)
CO2: 27 mmol/L (ref 22–32)
Calcium: 8.2 mg/dL — ABNORMAL LOW (ref 8.9–10.3)
Chloride: 99 mmol/L (ref 98–111)
Creatinine, Ser: 0.44 mg/dL (ref 0.44–1.00)
GFR, Estimated: >60 mL/min (ref >60)
Glucose, Bld: 111 mg/dL — ABNORMAL HIGH (ref 70–99)
Potassium: 3.5 mmol/L (ref 3.5–5.1)
Sodium: 133 mmol/L — ABNORMAL LOW (ref 135–145)
Total Bilirubin: 8.7 mg/dL — ABNORMAL HIGH (ref 0.3–1.2)
Total Protein: 7.5 g/dL (ref 6.5–8.1)

## 2021-05-08 LAB — CBC WITH DIFFERENTIAL/PLATELET
Abs Immature Granulocytes: 0 10*3/uL (ref 0.00–0.07)
Basophils Absolute: 0.1 10*3/uL (ref 0.0–0.1)
Basophils Relative: 1 %
Eosinophils Absolute: 0.1 10*3/uL (ref 0.0–0.5)
Eosinophils Relative: 1 %
HCT: 28.7 % — ABNORMAL LOW (ref 36.0–46.0)
Hemoglobin: 9.6 g/dL — ABNORMAL LOW (ref 12.0–15.0)
Lymphocytes Relative: 18 %
Lymphs Abs: 1.4 10*3/uL (ref 0.7–4.0)
MCH: 33.3 pg (ref 26.0–34.0)
MCHC: 33.4 g/dL (ref 30.0–36.0)
MCV: 99.7 fL (ref 80.0–100.0)
Monocytes Absolute: 0.5 10*3/uL (ref 0.1–1.0)
Monocytes Relative: 6 %
Neutro Abs: 5.6 10*3/uL (ref 1.7–7.7)
Neutrophils Relative %: 74 %
Platelets: 178 10*3/uL (ref 150–400)
RBC: 2.88 MIL/uL — ABNORMAL LOW (ref 3.87–5.11)
RDW: 21.6 % — ABNORMAL HIGH (ref 11.5–15.5)
WBC: 7.6 10*3/uL (ref 4.0–10.5)
nRBC: 0 % (ref 0.0–0.2)
nRBC: 0 /100 WBC

## 2021-05-08 LAB — LACTATE DEHYDROGENASE, PLEURAL OR PERITONEAL FLUID: LD, Fluid: 52 U/L — ABNORMAL HIGH (ref 3–23)

## 2021-05-08 LAB — URINALYSIS, ROUTINE W REFLEX MICROSCOPIC
Glucose, UA: NEGATIVE mg/dL
Hgb urine dipstick: NEGATIVE
Ketones, ur: NEGATIVE mg/dL
Leukocytes,Ua: NEGATIVE
Nitrite: NEGATIVE
Protein, ur: 30 mg/dL — AB
Specific Gravity, Urine: 1.013 (ref 1.005–1.030)
pH: 5 (ref 5.0–8.0)

## 2021-05-08 LAB — GLUCOSE, PLEURAL OR PERITONEAL FLUID: Glucose, Fluid: 108 mg/dL

## 2021-05-08 LAB — HEPATIC FUNCTION PANEL
ALT: 31 U/L (ref 0–44)
AST: 128 U/L — ABNORMAL HIGH (ref 15–41)
Albumin: 2.2 g/dL — ABNORMAL LOW (ref 3.5–5.0)
Alkaline Phosphatase: 191 U/L — ABNORMAL HIGH (ref 38–126)
Bilirubin, Direct: 4.7 mg/dL — ABNORMAL HIGH (ref 0.0–0.2)
Indirect Bilirubin: 4 mg/dL — ABNORMAL HIGH (ref 0.3–0.9)
Total Bilirubin: 8.7 mg/dL — ABNORMAL HIGH (ref 0.3–1.2)
Total Protein: 7.5 g/dL (ref 6.5–8.1)

## 2021-05-08 LAB — GRAM STAIN

## 2021-05-08 LAB — HEPATITIS PANEL, ACUTE
HCV Ab: NONREACTIVE
Hep A IgM: NONREACTIVE
Hep B C IgM: NONREACTIVE
Hepatitis B Surface Ag: NONREACTIVE

## 2021-05-08 LAB — HIV ANTIBODY (ROUTINE TESTING W REFLEX): HIV Screen 4th Generation wRfx: NONREACTIVE

## 2021-05-08 LAB — PROTIME-INR
INR: 1.6 — ABNORMAL HIGH (ref 0.8–1.2)
Prothrombin Time: 18.8 seconds — ABNORMAL HIGH (ref 11.4–15.2)

## 2021-05-08 LAB — I-STAT BETA HCG BLOOD, ED (MC, WL, AP ONLY): I-stat hCG, quantitative: <5 m[IU]/mL (ref <5)

## 2021-05-08 LAB — LIPASE, BLOOD: Lipase: 40 U/L (ref 11–51)

## 2021-05-08 LAB — PROTEIN, PLEURAL OR PERITONEAL FLUID: Total protein, fluid: <3 g/dL

## 2021-05-08 LAB — SARS CORONAVIRUS 2 (TAT 6-24 HRS): SARS Coronavirus 2: NEGATIVE

## 2021-05-08 LAB — PHOSPHORUS: Phosphorus: 4.2 mg/dL (ref 2.5–4.6)

## 2021-05-08 LAB — MAGNESIUM: Magnesium: 2 mg/dL (ref 1.7–2.4)

## 2021-05-08 MED ORDER — HYDROXYZINE HCL 25 MG PO TABS: 25.0000 mg | ORAL_TABLET | Freq: Three times a day (TID) | ORAL | Status: DC | PRN

## 2021-05-08 MED ORDER — FUROSEMIDE 20 MG PO TABS
20.0000 mg | ORAL_TABLET | Freq: Every day | ORAL | Status: DC
  Administered 2021-05-09: 20 mg via ORAL
  Filled 2021-05-08: qty 1

## 2021-05-08 MED ORDER — FOLIC ACID 1 MG PO TABS
1.0000 mg | ORAL_TABLET | Freq: Every day | ORAL | Status: DC
  Administered 2021-05-08 – 2021-05-09 (×2): 1 mg via ORAL
  Filled 2021-05-08 (×2): qty 1

## 2021-05-08 MED ORDER — LORAZEPAM 2 MG/ML IJ SOLN
0.0000 mg | Freq: Four times a day (QID) | INTRAMUSCULAR | Status: DC
  Administered 2021-05-08: 2 mg via INTRAVENOUS
  Filled 2021-05-08: qty 1

## 2021-05-08 MED ORDER — FAMOTIDINE 20 MG PO TABS
20.0000 mg | ORAL_TABLET | Freq: Two times a day (BID) | ORAL | Status: DC
  Administered 2021-05-08 – 2021-05-09 (×3): 20 mg via ORAL
  Filled 2021-05-08 (×3): qty 1

## 2021-05-08 MED ORDER — GABAPENTIN 300 MG PO CAPS: 300.0000 mg | ORAL_CAPSULE | ORAL | Status: DC

## 2021-05-08 MED ORDER — CHLORHEXIDINE GLUCONATE CLOTH 2 % EX PADS: 6.0000 | MEDICATED_PAD | Freq: Once | CUTANEOUS | Status: DC

## 2021-05-08 MED ORDER — ONDANSETRON HCL 4 MG/2ML IJ SOLN: 4.0000 mg | Freq: Four times a day (QID) | INTRAMUSCULAR | Status: DC | PRN

## 2021-05-08 MED ORDER — ACETAMINOPHEN 500 MG PO TABS: 1000.0000 mg | ORAL_TABLET | ORAL | Status: DC

## 2021-05-08 MED ORDER — ALBUTEROL SULFATE (2.5 MG/3ML) 0.083% IN NEBU: 2.5000 mg | INHALATION_SOLUTION | Freq: Four times a day (QID) | RESPIRATORY_TRACT | Status: DC | PRN

## 2021-05-08 MED ORDER — LORAZEPAM 2 MG/ML IJ SOLN: 0.0000 mg | Freq: Two times a day (BID) | INTRAMUSCULAR | Status: DC

## 2021-05-08 MED ORDER — LORAZEPAM 2 MG/ML IJ SOLN: 1.0000 mg | INTRAMUSCULAR | Status: DC | PRN

## 2021-05-08 MED ORDER — THIAMINE HCL 100 MG/ML IJ SOLN: 100.0000 mg | Freq: Every day | INTRAMUSCULAR | Status: DC

## 2021-05-08 MED ORDER — ENSURE PRE-SURGERY PO LIQD
592.0000 mL | Freq: Once | ORAL | Status: DC
  Filled 2021-05-08: qty 592

## 2021-05-08 MED ORDER — LIDOCAINE HCL 1 % IJ SOLN
INTRAMUSCULAR | Status: AC
  Filled 2021-05-08: qty 20

## 2021-05-08 MED ORDER — BUPIVACAINE LIPOSOME 1.3 % IJ SUSP
20.0000 mL | Freq: Once | INTRAMUSCULAR | Status: DC
  Filled 2021-05-08: qty 20

## 2021-05-08 MED ORDER — LIDOCAINE HCL (PF) 1 % IJ SOLN
INTRAMUSCULAR | Status: AC | PRN
  Administered 2021-05-08: 10 mL

## 2021-05-08 MED ORDER — CEFAZOLIN SODIUM-DEXTROSE 2-4 GM/100ML-% IV SOLN
2.0000 g | INTRAVENOUS | Status: DC
  Filled 2021-05-08: qty 100

## 2021-05-08 MED ORDER — PHYTONADIONE 5 MG PO TABS
10.0000 mg | ORAL_TABLET | Freq: Every day | ORAL | Status: DC
  Administered 2021-05-08 – 2021-05-09 (×2): 10 mg via ORAL
  Filled 2021-05-08 (×2): qty 2

## 2021-05-08 MED ORDER — THIAMINE HCL 100 MG PO TABS
100.0000 mg | ORAL_TABLET | Freq: Every day | ORAL | Status: DC
  Administered 2021-05-08 – 2021-05-09 (×2): 100 mg via ORAL
  Filled 2021-05-08 (×2): qty 1

## 2021-05-08 MED ORDER — SPIRONOLACTONE 25 MG PO TABS
50.0000 mg | ORAL_TABLET | Freq: Every day | ORAL | Status: DC
  Administered 2021-05-09: 50 mg via ORAL
  Filled 2021-05-08: qty 2

## 2021-05-08 MED ORDER — ENSURE PRE-SURGERY PO LIQD
296.0000 mL | Freq: Once | ORAL | Status: DC
  Filled 2021-05-08: qty 296

## 2021-05-08 MED ORDER — ESCITALOPRAM OXALATE 10 MG PO TABS
5.0000 mg | ORAL_TABLET | Freq: Every day | ORAL | Status: DC
  Administered 2021-05-08 – 2021-05-09 (×2): 5 mg via ORAL
  Filled 2021-05-08 (×2): qty 1

## 2021-05-08 MED ORDER — LORAZEPAM 1 MG PO TABS: 1.0000 mg | ORAL_TABLET | ORAL | Status: DC | PRN

## 2021-05-08 MED ORDER — ADULT MULTIVITAMIN W/MINERALS CH
1.0000 | ORAL_TABLET | Freq: Every day | ORAL | Status: DC
  Administered 2021-05-08 – 2021-05-09 (×2): 1 via ORAL
  Filled 2021-05-08 (×2): qty 1

## 2021-05-08 MED ORDER — SODIUM CHLORIDE 0.9% FLUSH
3.0000 mL | Freq: Two times a day (BID) | INTRAVENOUS | Status: DC
  Administered 2021-05-08 – 2021-05-09 (×3): 3 mL via INTRAVENOUS

## 2021-05-08 MED ORDER — ONDANSETRON HCL 4 MG PO TABS: 4.0000 mg | ORAL_TABLET | Freq: Four times a day (QID) | ORAL | Status: DC | PRN

## 2021-05-08 NOTE — H&P (Signed)
History and Physical    Brandi James ION:[id number] DOB: 07-17-1981 DOA: 05/08/2021  Referring MD/NP/PA: Leticia Penna, DO  PCP: Mayer Masker, PA-C  Patient coming from: Home  Chief Complaint: Abdominal swelling and yellowing of her eyes  I have personally briefly reviewed patient's old medical records in [facility]   HPI: Brandi James is a 40 y.o. female with medical history significant of alcohol abuse, hypertension, anxiety, and depression who presents with complaints of abdominal swelling and yellowing of eyes.  She reports that she has noticed progressive distention of her abdomen over the last several weeks to months.  However, over the last 2 weeks her eyes have become dark yellow appearance for which her coworkers had commented.  She used to drink about 3 bottles of wine per day on average, but had been gradually cutting back slowly and is currently only drinking half bottle of wine daily.  She denies having any fever, chest pain, shortness of breath, cough abdominal pain, nausea, vomiting, diarrhea, or blood in stools.  She admits that she had some lower extremity swelling couple days ago, but it self resolved after drinking water.  She has also noticed a rash of her hands.  ED Course: Upon admission into the emergency department patient was noted to be afebrile and mildly tachycardic with all other vital signs stable.  Labs were significant for hemoglobin 9.6, sodium 133, alkaline phosphatase 191, lipase 40, albumin 2.2, AST 131, ALT 31, and total bilirubin 8.7.  Patient was ordered to have diagnostic paracentesis with interventional radiology and gastroenterology was formally located to evaluate.  TRH called to admit  Review of Systems  Constitutional: Negative for chills and fever.  HENT: Negative for ear discharge and nosebleeds.   Eyes: Negative for photophobia.       Positive for eye color change  Respiratory: Negative for cough and hemoptysis.   Cardiovascular:  Positive for leg swelling. Negative for chest pain.  Gastrointestinal: Negative for nausea and vomiting.       Positive for abdominal distention  Genitourinary: Negative for dysuria and hematuria.  Musculoskeletal: Negative for falls.  Skin: Positive for rash.  Neurological: Negative for focal weakness and loss of consciousness.  Psychiatric/Behavioral: Positive for substance abuse. Negative for memory loss.  All other systems reviewed and are negative.   Past Medical History:  Diagnosis Date  . Depression with anxiety   . Hypertension     01/20/2021  . Panic attacks   . Substance abuse (HCC)    01/20/2021    Past Surgical History:  Procedure Laterality Date  . LAPAROSCOPIC CHOLECYSTECTOMY  2009  . LAPAROSCOPIC GASTRIC BYPASS N/A 2003   [facility].  Dr Adele Dan  . PANNICULECTOMY  2004   To address excessive skin following weight loss     reports that she has been smoking cigarettes. She has been smoking about 0.50 packs per day. She has never used smokeless tobacco. She reports current alcohol use of about 12.0 standard drinks of alcohol per week. She reports that she does not use drugs.  Allergies  Allergen Reactions  . Nsaids Other (See Comments)    H/o Roux-en-Y gastric bypass  . Morphine And Related Other (See Comments)    Extreme mood swings     Family History  Problem Relation Age of Onset  . Healthy Mother   . Diabetes Father   . Hypertension Father     Prior to Admission medications   Medication Sig Start Date End Date Taking? Authorizing  Provider  escitalopram (LEXAPRO) 5 MG tablet Take 1 tablet (5 mg total) by mouth daily. 02/26/21 03/28/21 Yes Abonza, Maritza, PA-C  hydrOXYzine (ATARAX/VISTARIL) 25 MG tablet Take 1 tablet (25 mg total) by mouth every 8 (eight) hours as needed for anxiety. 12/26/20  Yes Lamptey, Britta Mccreedy, MD  gabapentin (NEURONTIN) 100 MG capsule Take 1 capsule (100 mg total) by mouth 3 (three) times daily. Start one capsule at night, then may  add one capsule at dinner, then may add one capsule at breakfast 04/15/20 12/26/20  Delories Heinz, DPM  propranolol (INDERAL) 10 MG tablet TAKE 1 TABLET BY MOUTH 2 TIMES DAILY. PATIENT NEEDS TELEMEDICINE VISIT PRIOR TO ANY FURTHER REFILLS Patient not taking: No sig reported 05/23/19 12/26/20  William Hamburger D, NP    Physical Exam:  Constitutional: Ill-appearing middle-aged female in no acute distress Vitals:   05/08/21 0700 05/08/21 0845 05/08/21 0900 05/08/21 1015  BP: 106/72 120/78 112/67 118/76  Pulse: 95 95 95 100  Resp:  15 16 16   Temp:      TempSrc:      SpO2: 95% 95% 96% 94%  Weight:      Height:       Eyes: PERRL, sclera icterus appreciated ENMT: Mucous membranes are moist. Posterior pharynx clear of any exudate or lesions. Neck: normal, supple, no masses, no thyromegaly.  No JVD appreciated. Respiratory: Normal respiratory effort with decreased overall aeration. Cardiovascular: Regular rate and rhythm, no murmurs / rubs / gallops. No extremity edema. 2+ pedal pulses. No carotid bruits.  Abdomen: Distended abdomen with no significant tenderness to palpation.  Bowel sounds present. Musculoskeletal: no clubbing / cyanosis. No joint deformity upper and lower extremities. Good ROM, no contractures. Normal muscle tone.  Skin: Spider angiomas appreciated left neck.  Discoloration noted of the bilateral hands Neurologic: CN 2-12 grossly intact. Sensation intact, DTR normal. Strength 5/5 in all 4.  Psychiatric: Normal judgment and insight. Alert and oriented x 3. Normal mood.     Labs on Admission: I have personally reviewed following labs and imaging studies  CBC: Recent Labs  Lab 05/07/21 1319 05/08/21 0550  WBC 9.5 7.6  NEUTROABS 6.6 5.6  HGB 10.5* 9.6*  HCT 29.0* 28.7*  MCV 92 99.7  PLT 196 178   Basic Metabolic Panel: Recent Labs  Lab 05/07/21 1319 05/08/21 0550  NA 133* 133*  K 4.2 3.5  CL 98 99  CO2 25 27  GLUCOSE 106* 111*  BUN 2* <5*  CREATININE 0.42*  0.44  CALCIUM 8.5* 8.2*   GFR: Estimated Creatinine Clearance: 101.5 mL/min (by C-G formula based on SCr of 0.44 mg/dL). Liver Function Tests: Recent Labs  Lab 05/07/21 1319 05/08/21 0550 05/08/21 0709  AST 158* 131* 128*  ALT 30 31 31   ALKPHOS 279* 191* 191*  BILITOT 9.3* 8.7* 8.7*  PROT 7.1 7.5 7.5  ALBUMIN 3.1* 2.2* 2.2*   Recent Labs  Lab 05/07/21 1319 05/08/21 0550  LIPASE 35 40   No results for input(s): AMMONIA in the last 168 hours. Coagulation Profile: Recent Labs  Lab 05/08/21 0709  INR 1.6*   Cardiac Enzymes: No results for input(s): CKTOTAL, CKMB, CKMBINDEX, TROPONINI in the last 168 hours. BNP (last 3 results) No results for input(s): PROBNP in the last 8760 hours. HbA1C: No results for input(s): HGBA1C in the last 72 hours. CBG: No results for input(s): GLUCAP in the last 168 hours. Lipid Profile: No results for input(s): CHOL, HDL, LDLCALC, TRIG, CHOLHDL, LDLDIRECT in the last 72  hours. Thyroid Function Tests: No results for input(s): TSH, T4TOTAL, FREET4, T3FREE, THYROIDAB in the last 72 hours. Anemia Panel: No results for input(s): VITAMINB12, FOLATE, FERRITIN, TIBC, IRON, RETICCTPCT in the last 72 hours. Urine analysis:    Component Value Date/Time   COLORURINE AMBER (A) 05/08/2021 0546   APPEARANCEUR HAZY (A) 05/08/2021 0546   LABSPEC 1.013 05/08/2021 0546   PHURINE 5.0 05/08/2021 0546   GLUCOSEU NEGATIVE 05/08/2021 0546   HGBUR NEGATIVE 05/08/2021 0546   BILIRUBINUR SMALL (A) 05/08/2021 0546   BILIRUBINUR large (A) 05/07/2021 1306   KETONESUR NEGATIVE 05/08/2021 0546   PROTEINUR 30 (A) 05/08/2021 0546   UROBILINOGEN 2.0 (A) 05/07/2021 1306   UROBILINOGEN 1.0 04/08/2008 0153   NITRITE NEGATIVE 05/08/2021 0546   LEUKOCYTESUR NEGATIVE 05/08/2021 0546   Sepsis Labs: No results found for this or any previous visit (from the past 240 hour(s)).   Radiological Exams on Admission: US Abdomen Limited RUQ (LIVER/GB)  Result Date:  05/08/2021 CLINICAL DATA:  40 year old female with cirrhosis.  Ascites. EXAM: ULTRASOUND ABDOMEN LIMITED RIGHT UPPER QUADRANT COMPARISON:  MRI abdomen 04/08/2008. FINDINGS: Gallbladder: Reportedly surgically absent. Common bile duct: Diameter: Could not be visualized. Liver: Echogenic liver (image 12) with nodular liver contour. No discrete liver lesion. No intrahepatic biliary ductal dilatation. Portal vein could not be visualized. Other: Negative visible right kidney. Small volume perihepatic ascites (images 12, 23). Grayscale ultrasound images were also obtained in both gutters and lower quadrants. Small volume of ascites is present overall, primarily pooling in the right lower quadrant (image 30). Splenomegaly (length approximately 17 cm. IMPRESSION: 1. Cirrhosis with evidence of fatty liver disease. No discrete liver lesion. Portal vein and common bile duct could not be visualized by ultrasound. 2. Small volume of ascites overall, most abundant in the right lower quadrant. Electronically Signed   By: Odessa FlemingH  Hall M.D.   On: 05/08/2021 10:06    EKG: Independently reviewed.  Sinus tachycardia 104 bpm  Assessment/Plan Decompensated liver cirrhosis with ascites secondary to history of alcohol abuse: Patient presents with complaints of jaundice and abdominal distention.  Found to have elevated liver enzymes, bilirubin, and INR.  Ultrasound of the abdomen revealed signs of liver cirrhosis with evidence of fatty liver disease, splenomegaly, and small volume ascites.  Meld score 23.  Low suspicion for SBP at this time.  IR consulted for diagnostic paracentesis.  Patient reports been attempting to cut back drinking only half a bottle of wine per day on average currently. -Admit to a medical telemetry bed -CIWA protocols initiated with scheduled Ativan and as needed Ativan -2 g salt restricted diet  -Follow-up alpha-fetoprotein, acute hepatitis panel, and other pending studies -Follow-up paracentesis fluid  analysis -Transitions of care consulted for alcohol abuse along with continued counseling on need of cessation of alcohol use -Plan to start furosemide 20 mg and spironolactone 50 mg in a.m. -Recommend outpatient vaccines for hepatitis and Pneumovax as well as need of surveillance EGD -Appreciate GI consultative services we will follow-up for any further recommendations.  Transaminitis and hyperbilirubinemia: AST 131, ALT 31, alkaline phosphatase 191, and total bilirubin 8.7(indirect bilirubin 4).  Secondary to above. -Continue to monitor  Coagulopathy: Acute.  INR elevated at 1.6. -Vitamin K per GI  Hyponatremia: Acute.  Sodium 133 on admission.  Suspect related with patient's liver cirrhosis, but other possible causes include patient being on Lexapro. -Continue to monitor daily  Normocytic anemia: Hemoglobin 9.6 g/dL, but yesterday on check was 10.5 g/dL.  Hemoglobin was previously noted to be within  normal limits earlier this year. -Check stool guaiac and ensure patient not acutely bleeding. -Continue to monitor H&H  Anxiety: Patient on Lexapro 5 mg daily and hydroxyzine 25 mg every 8 hours as needed for anxiety. -Continue Lexapro and hydralazine  DVT prophylaxis: SCD Code Status: full Family Communication: Declined need to update  Disposition Plan: We discharge home once medically Consults called: GI Admission status: inpatient more than 2 midnight stay for decompensated liver cirrhosis  Clydie Braun MD [facility]   If 7PM-7AM, please contact night-coverage   05/08/2021, 11:15 AM

## 2021-05-08 NOTE — ED Triage Notes (Signed)
Abdominal distention and yellow sclera since last week. Was advised to come to ED by urgent care. Also elevated liver and bilirubin. Denies any nausea, vomiting and diarrhea.

## 2021-05-08 NOTE — Plan of Care (Signed)

## 2021-05-08 NOTE — ED Provider Notes (Signed)
[facility]   CSN: [id number] Arrival date & time: 05/08/21  0531     History Chief Complaint  Patient presents with  . abdominal distention    Brandi CortiMary E James is a 40 y.o. female, with a history of alcohol use, anxiety/depression, presenting for evaluation of abdominal distention and jaundice.  She reports she initially noted abdominal distention last year that has slowly increased since onset.  She attributed to possible constipation and did not seek further evaluation.  However, last week she started noting yellowing of her eyes and one of her friends recommended further evaluation.  She has a known history of alcohol use, previously 2-3 bottles of wine for several years, recently cut back to half bottle of wine since January when her LFTs were slightly elevated at urgent care.  Denies any abdominal pain, fever, chills, lightheadedness/dizziness, N/V, melena/hematochezia.  Feels like she has urinated less for the past week, normal BMs.  She was seen yesterday at urgent care and due to her clinical findings, recommended evaluation in the ED.  She has no previously diagnosed concern with her liver.  She is a single parent.  She has a history of cholecystectomy.     Past Medical History:  Diagnosis Date  . Depression with anxiety   . Hypertension     01/20/2021  . Panic attacks   . Substance abuse (HCC)    01/20/2021    Patient Active Problem List   Diagnosis Date Noted  . Elevated TSH 01/24/2019  . HTN, goal below 130/80 01/23/2019  . Obesity (BMI 30.0-34.9) 09/08/2017  . Healthcare maintenance 09/08/2017  . Severe episode of recurrent major depressive disorder, without psychotic features (HCC)   . Severe major depression without psychotic features (HCC) 12/19/2015  . Generalized anxiety disorder 12/19/2015    Past Surgical History:  Procedure Laterality Date  . LAPAROSCOPIC CHOLECYSTECTOMY  2009  . LAPAROSCOPIC GASTRIC  BYPASS N/A 2003   [facility].  Dr Adele DanAurora Pryor  . PANNICULECTOMY  2004   To address excessive skin following weight loss     OB History   No obstetric history on file.     Family History  Problem Relation Age of Onset  . Healthy Mother   . Diabetes Father   . Hypertension Father     Social History   Tobacco Use  . Smoking status: Current Some Day Smoker    Packs/day: 0.50    Types: Cigarettes  . Smokeless tobacco: Never Used  Vaping Use  . Vaping Use: Never used  Substance Use Topics  . Alcohol use: Yes    Alcohol/week: 12.0 standard drinks    Types: 12 Glasses of wine per week    Comment: 2 bottles of wine daily *  . Drug use: No    Home Medications Prior to Admission medications   Medication Sig Start Date End Date Taking? Authorizing Provider  escitalopram (LEXAPRO) 5 MG tablet Take 1 tablet (5 mg total) by mouth daily. 02/26/21 03/28/21 Yes Abonza, Maritza, PA-C  hydrOXYzine (ATARAX/VISTARIL) 25 MG tablet Take 1 tablet (25 mg total) by mouth every 8 (eight) hours as needed for anxiety. 12/26/20  Yes Lamptey, Britta MccreedyPhilip O, MD  gabapentin (NEURONTIN) 100 MG capsule Take 1 capsule (100 mg total) by mouth 3 (three) times daily. Start one capsule at night, then may add one capsule at dinner, then may add one capsule at breakfast 04/15/20 12/26/20  Delories HeinzEgerton, Kathryn P, DPM  propranolol (INDERAL) 10 MG tablet TAKE  1 TABLET BY MOUTH 2 TIMES DAILY. PATIENT NEEDS TELEMEDICINE VISIT PRIOR TO ANY FURTHER REFILLS Patient not taking: No sig reported 05/23/19 12/26/20  William Hamburger D, NP    Allergies    Nsaids and Morphine and related  Review of Systems   Review of Systems  Constitutional: Negative for fever.  Respiratory: Negative for cough and shortness of breath.   Cardiovascular: Negative for chest pain.  Gastrointestinal: Positive for abdominal distention. Negative for abdominal pain, blood in stool, constipation, diarrhea, nausea and vomiting.  Genitourinary: Positive for decreased urine  volume. Negative for dysuria, frequency and hematuria.  Skin: Positive for color change. Negative for pallor and rash.  Neurological: Negative for dizziness, tremors, weakness and light-headedness.    Physical Exam Updated Vital Signs BP 118/76   Pulse 100   Temp 99 F (37.2 C) (Oral)   Resp 16   Ht 5\' 5"  (1.651 m)   Wt 89.9 kg   SpO2 94%   BMI 32.98 kg/m   Physical Exam Constitutional:      General: She is not in acute distress.    Appearance: Normal appearance.  HENT:     Head: Normocephalic and atraumatic.     Nose: Nose normal.     Mouth/Throat:     Mouth: Mucous membranes are moist.  Eyes:     General: Scleral icterus present.     Extraocular Movements: Extraocular movements intact.     Pupils: Pupils are equal, round, and reactive to light.  Cardiovascular:     Rate and Rhythm: Normal rate and regular rhythm.     Heart sounds: No murmur heard.   Pulmonary:     Effort: Pulmonary effort is normal.     Breath sounds: Normal breath sounds.  Abdominal:     Tenderness: There is no right CVA tenderness or left CVA tenderness.     Comments: Abdominal distention present.  Enlarged liver palpated in the RUQ.  Nontender to palpation of all 4 quadrants.  Normoactive bowel sounds.  Musculoskeletal:     Cervical back: Neck supple.     Comments: Trace edema bilaterally to mid shin  Skin:    General: Skin is warm and dry.     Coloration: Skin is jaundiced.  Neurological:     General: No focal deficit present.     Mental Status: She is alert and oriented to person, place, and time.  Psychiatric:        Mood and Affect: Mood normal.        Behavior: Behavior normal.     ED Results / Procedures / Treatments   Labs (all labs ordered are listed, but only abnormal results are displayed) Labs Reviewed  CBC WITH DIFFERENTIAL/PLATELET - Abnormal; Notable for the following components:      Result Value   RBC 2.88 (*)    Hemoglobin 9.6 (*)    HCT 28.7 (*)    RDW 21.6 (*)     All other components within normal limits  COMPREHENSIVE METABOLIC PANEL - Abnormal; Notable for the following components:   Sodium 133 (*)    Glucose, Bld 111 (*)    BUN <5 (*)    Calcium 8.2 (*)    Albumin 2.2 (*)    AST 131 (*)    Alkaline Phosphatase 191 (*)    Total Bilirubin 8.7 (*)    All other components within normal limits  URINALYSIS, ROUTINE W REFLEX MICROSCOPIC - Abnormal; Notable for the following components:   Color, Urine AMBER (*)  APPearance HAZY (*)    Bilirubin Urine SMALL (*)    Protein, ur 30 (*)    Bacteria, UA RARE (*)    All other components within normal limits  PROTIME-INR - Abnormal; Notable for the following components:   Prothrombin Time 18.8 (*)    INR 1.6 (*)    All other components within normal limits  HEPATIC FUNCTION PANEL - Abnormal; Notable for the following components:   Albumin 2.2 (*)    AST 128 (*)    Alkaline Phosphatase 191 (*)    Total Bilirubin 8.7 (*)    Bilirubin, Direct 4.7 (*)    Indirect Bilirubin 4.0 (*)    All other components within normal limits  LIPASE, BLOOD  HEPATITIS PANEL, ACUTE  AFP TUMOR MARKER  I-STAT BETA HCG BLOOD, ED ([facility], [facility], [facility] ONLY)    EKG None  Radiology US Abdomen Limited RUQ (LIVER/GB)  Result Date: 05/08/2021 CLINICAL DATA:  40 year old female with cirrhosis.  Ascites. EXAM: ULTRASOUND ABDOMEN LIMITED RIGHT UPPER QUADRANT COMPARISON:  MRI abdomen 04/08/2008. FINDINGS: Gallbladder: Reportedly surgically absent. Common bile duct: Diameter: Could not be visualized. Liver: Echogenic liver (image 12) with nodular liver contour. No discrete liver lesion. No intrahepatic biliary ductal dilatation. Portal vein could not be visualized. Other: Negative visible right kidney. Small volume perihepatic ascites (images 12, 23). Grayscale ultrasound images were also obtained in both gutters and lower quadrants. Small volume of ascites is present overall, primarily pooling in the right lower quadrant (image 30).  Splenomegaly (length approximately 17 cm. IMPRESSION: 1. Cirrhosis with evidence of fatty liver disease. No discrete liver lesion. Portal vein and common bile duct could not be visualized by ultrasound. 2. Small volume of ascites overall, most abundant in the right lower quadrant. Electronically Signed   By: Odessa Fleming M.D.   On: 05/08/2021 10:06    Procedures Procedures   Medications Ordered in ED Medications  lidocaine (XYLOCAINE) 1 % (with pres) injection (has no administration in time range)  lidocaine (PF) (XYLOCAINE) 1 % injection (10 mLs Infiltration Given 05/08/21 1053)    ED Course  I have reviewed the triage vital signs and the nursing notes.  Pertinent labs & imaging results that were available during my care of the patient were reviewed by me and considered in my medical decision making (see chart for details).  Clinical Course as of 05/08/21 1107  Thu May 08, 2021  0827 Spoke with GI. Will obtain RUQ U/S and paracentesis first with AFP level.  [SB]    Clinical Course User Index [SB] Allayne Stack, DO   MDM Rules/Calculators/A&P                          40 year old female, with a history of alcohol use disorder and cholecystectomy, presenting for evaluation of worsening abdominal distention and jaundice, overall presentation concerning for decompensated alcoholic liver cirrhosis.  Low suspicion for obstructive pathology with known history of cholecystectomy, but will assess.  Clinically jaundiced with scleral icterus and abdominal distention, but overall hemodynamically stable, meld-Na score of 23. T bili 8.7, previously 1.6 in 12/2020, elevated AST, albumin 2.2, hemoglobin 9.6, previous baseline around 13-14 without previous evidence of bleeding per patient.   RUQ U/S showing evidence of liver cirrhosis without focal lesions, small ascites, and splenomegaly--overall consistent with presentation as above.  Paracentesis will be attempted via IR, however minimal concern for  SBP.  Already spoke with GI who will see the patient later today.  Will require admission for decompensated liver cirrhosis, spoke with hospitalist, Dr. Katrinka Blazing, who will admit  Final Clinical Impression(s) / ED Diagnoses Final diagnoses:  Ascites due to alcoholic cirrhosis [facility])    Rx / DC Orders ED Discharge Orders    None       Allayne Stack, DO 05/08/21 1133    Milagros Loll, MD 05/14/21 [phone]

## 2021-05-08 NOTE — Consult Note (Signed)
[facility] Consult: 10:23 AM 05/08/2021  LOS: 0 days    Referring Provider: [name] Hillock DO Primary Care Physician:  Ellouise Newer, [facility].   Primary Gastroenterologist:  Althia Forts.      Reason for Consultation: New diagnosis of decompensated cirrhosis.   HPI: Brandi James is a 40 y.o. female.  PMH depression/anxiety/panic disorder.  Alcohol and Klonopin "overdose" with behavioral health admission in 12/2015.  Alcohol abuse.  Substance abuse.  Htn.  Morbid obesity.  OSA.  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  Laparoscopic cholecystectomy 2009.  Incisional hernia, was considered for repair by Dr. Johney Maine as of notes from 03/2021..  Diastases recti. Outpatient infusion treatment in 07/2020 for COVID-19.  Patient is an alcoholic.  During COVID times she was drinking up to 3 bottles of wine daily.  In recent weeks she cut back to 1 bottle daily and is now consuming a half bottle daily.  As far back as 2020 was advised she had elevated LFTs but no specific investigation undertaken.    Progressive abdominal distention/swelling over 6 months.  Scleral icterus over the last 2 weeks.  LE edema last week which has resolved.  Some mild postprandial nausea but no emesis.  Abdomen feels bloated but not painful.  Stools loose but not bloody or tarry.  Not aware of any previous diagnosis of anemia.  No unusual or excessive bleeding or bruising.  No pruritus.  No seizures.  Palpable liver edge, scleral icterus, jaundice on exam in urgent care yesterday.  Elevated LFTs. Referred to ED, arrived this AM.    T bil 8.7, direct 4.7.  Alk phos 191.  AST/ALT 131/31.  Albumin 2.2. Hepatitis B surface antigen, HCV antibody, hepatitis A IgM, hepatitis B core IgM all negative. Sodium 133.   BUN/creatinine normal. Hgb 9.6.  MCV 99.7.  WBCs, platelets normal. Abdominal ultrasound: Cirrhosis and fatty liver.  No liver lesions.  PV and CBD not visualized.  Small volume ascites most prononced in RLQ.   Liver was normal on MRCP 2009.  1.4 liter paracentisis this AM.  Fluid studies pndg.    Occupation: Middle Education officer, [employer].  Alcohol consumption as above.  Smokes about 4 cigarettes a day which is down overall.  Lives alone in her home with her son.  Family lives next door.  Family history geta for autoimmune disorders, liver disease, alcoholism.  Her father has gout.  No family history of colorectal cancer, PUD, anemia.    Past Medical History:  Diagnosis Date  . Depression with anxiety   . Hypertension     01/20/2021  . Panic attacks   . Substance abuse ([place])    01/20/2021    Past Surgical History:  Procedure Laterality Date  . LAPAROSCOPIC CHOLECYSTECTOMY  2009  . LAPAROSCOPIC GASTRIC BYPASS N/A 2003   [facility].  Dr Lise Auer  . PANNICULECTOMY  2004   To address excessive skin following weight loss    Prior to Admission medications   Medication Sig Start Date End Date Taking? Authorizing Provider  escitalopram (LEXAPRO)  5 MG tablet Take 1 tablet (5 mg total) by mouth daily. 02/26/21 03/28/21  Lorrene Reid, PA-C  hydrOXYzine (ATARAX/VISTARIL) 25 MG tablet Take 1 tablet (25 mg total) by mouth every 8 (eight) hours as needed for anxiety. 12/26/20   Lamptey, Myrene Galas, MD  gabapentin (NEURONTIN) 100 MG capsule Take 1 capsule (100 mg total) by mouth 3 (three) times daily. Start one capsule at night, then may add one capsule at dinner, then may add one capsule at breakfast 04/15/20 12/26/20  Bronson Ing, DPM  propranolol (INDERAL) 10 MG tablet TAKE 1 TABLET BY MOUTH 2 TIMES DAILY. PATIENT NEEDS TELEMEDICINE VISIT PRIOR TO ANY FURTHER REFILLS Patient not taking: No sig reported 05/23/19 12/26/20  Mina Marble D, NP    Scheduled Meds:  Infusions:  PRN Meds:    Allergies  as of 05/08/2021 - Review Complete 05/08/2021  Allergen Reaction Noted  . Nsaids Other (See Comments) 03/24/2021  . Morphine and related Other (See Comments) 09/08/2017    Family History  Problem Relation Age of Onset  . Healthy Mother   . Diabetes Father   . Hypertension Father     Social History   Socioeconomic History  . Marital status: Single    Spouse name: Not on file  . Number of children: Not on file  . Years of education: Not on file  . Highest education level: Not on file  Occupational History  . Not on file  Tobacco Use  . Smoking status: Current Some Day Smoker    Packs/day: 0.50    Types: Cigarettes  . Smokeless tobacco: Never Used  Vaping Use  . Vaping Use: Never used  Substance and Sexual Activity  . Alcohol use: Yes    Alcohol/week: 12.0 standard drinks    Types: 12 Glasses of wine per week    Comment: 2 bottles of wine daily *  . Drug use: No  . Sexual activity: Not Currently    Birth control/protection: None  Other Topics Concern  . Not on file  Social History Narrative  . Not on file   Social Determinants of Health   Financial Resource Strain: Not on file  Food Insecurity: Not on file  Transportation Needs: Not on file  Physical Activity: Not on file  Stress: Not on file  Social Connections: Not on file  Intimate Partner Violence: Not on file    REVIEW OF SYSTEMS: Constitutional: Some fatigue but this is not profound.  No weakness. ENT:  No nose bleeds Pulm: No shortness of breath or cough. CV:  No palpitations, no LE edema.  No angina GU:  No hematuria, no frequency GYN: Irregular, infrequent menses for at least a year.  Last period was in February 2022. GI: See HPI Heme: Denies excessive or unusual bleeding or bruising. Transfusions: None. Neuro:  No headaches, no peripheral tingling or numbness Derm:  No itching, no rash or sores.  Endocrine:  No sweats or chills.  No polyuria or dysuria Immunization: Reviewed.  Do not see  documentation of COVID-19 vaccine.    PHYSICAL EXAM: Vital signs in last 24 hours: Vitals:   05/08/21 0900 05/08/21 1015  BP: 112/67 118/76  Pulse: 95 100  Resp: 16 16  Temp:    SpO2: 96% 94%   Wt Readings from Last 3 Encounters:  05/08/21 89.9 kg  01/21/21 89.9 kg  12/26/20 89.4 kg    General: Overweight, pleasant, comfortable, mildly jaundiced.  Not acutely ill in appearance. Head: Facial asymmetry or swelling.  No signs of head trauma. Eyes: Mild scleral icterus.  No conjunctival pallor. Ears: Not hard of hearing Nose: No congestion or discharge Mouth: Tongue midline.  Mucosa pink, moist, clear.  Fair dentition. Neck: No JVD, masses, thyromegaly. Lungs: Clear bilaterally without labored breathing or cough. Heart: RRR.  No MRG.  S1, S2 present. Abdomen: Soft, obese.  No palpable hepatosplenomegaly.  No bruits.  No obvious hernias.  Active bowel sounds Rectal: Deferred Musc/Skeltl: No joint redness, swelling or gross deformity. Extremities: No CCE. Neurologic: No tremor or asterixis.  Moves all 4 limbs with full strength.  Fully alert and oriented.  Excellent historian. Skin: Symmetric erythema in the region of knuckles on both hands Nodes: No cervical adenopathy Psych: Cooperative, calm, pleasant.  Intake/Output from previous day: No intake/output data recorded. Intake/Output this shift: No intake/output data recorded.  LAB RESULTS: Recent Labs    05/07/21 1319 05/08/21 0550  WBC 9.5 7.6  HGB 10.5* 9.6*  HCT 29.0* 28.7*  PLT 196 178   BMET Lab Results  Component Value Date   NA 133 (L) 05/08/2021   NA 133 (L) 05/07/2021   NA 136 12/26/2020   K 3.5 05/08/2021   K 4.2 05/07/2021   K 3.8 12/26/2020   CL 99 05/08/2021   CL 98 05/07/2021   CL 97 12/26/2020   CO2 27 05/08/2021   CO2 25 05/07/2021   CO2 20 12/26/2020   GLUCOSE 111 (H) 05/08/2021   GLUCOSE 106 (H) 05/07/2021   GLUCOSE 103 (H) 12/26/2020   BUN <5 (L) 05/08/2021   BUN 2 (L) 05/07/2021    BUN 4 (L) 12/26/2020   CREATININE 0.44 05/08/2021   CREATININE 0.42 (L) 05/07/2021   CREATININE 0.59 12/26/2020   CALCIUM 8.2 (L) 05/08/2021   CALCIUM 8.5 (L) 05/07/2021   CALCIUM 9.4 12/26/2020   LFT Recent Labs    05/07/21 1319 05/08/21 0550 05/08/21 0709  PROT 7.1 7.5 7.5  ALBUMIN 3.1* 2.2* 2.2*  AST 158* 131* 128*  ALT _0 ALKPHOS 279* 191* 191*  BILITOT 9.3* 8.7* 8.7*  BILIDIR  --   --  4.7*  IBILI  --   --  4.0*   PT/INR Lab Results  Component Value Date   INR 1.6 (H) 05/08/2021   Hepatitis Panel No results for input(s): HEPBSAG, HCVAB, HEPAIGM, HEPBIGM in the last 72 hours. C-Diff No components found for: CDIFF Lipase     Component Value Date/Time   LIPASE 40 05/08/2021 0550    Drugs of Abuse     Component Value Date/Time   LABOPIA NONE DETECTED 12/19/2015 0255   COCAINSCRNUR NONE DETECTED 12/19/2015 0255   LABBENZ NONE DETECTED 12/19/2015 0255   AMPHETMU NONE DETECTED 12/19/2015 0255   THCU NONE DETECTED 12/19/2015 0255   LABBARB NONE DETECTED 12/19/2015 0255     RADIOLOGY STUDIES: US Abdomen Limited RUQ (LIVER/GB)  Result Date: 05/08/2021 CLINICAL DATA:  40 year old female with cirrhosis.  Ascites. EXAM: ULTRASOUND ABDOMEN LIMITED RIGHT UPPER QUADRANT COMPARISON:  MRI abdomen 04/08/2008. FINDINGS: Gallbladder: Reportedly surgically absent. Common bile duct: Diameter: Could not be visualized. Liver: Echogenic liver (image 12) with nodular liver contour. No discrete liver lesion. No intrahepatic biliary ductal dilatation. Portal vein could not be visualized. Other: Negative visible right kidney. Small volume perihepatic ascites (images 12, 23). Grayscale ultrasound images were also obtained in both gutters and lower quadrants. Small volume of ascites is present overall, primarily pooling in the right lower quadrant (image 30). Splenomegaly (length approximately 17 cm. IMPRESSION:  1. Cirrhosis with evidence of fatty liver disease. No discrete liver  lesion. Portal vein and common bile duct could not be visualized by ultrasound. 2. Small volume of ascites overall, most abundant in the right lower quadrant. Electronically Signed   By: Genevie Ann M.D.   On: 05/08/2021 10:06     IMPRESSION:   *   Cirrhosis of the liver likely due to ETOH. Suspect concomitant alcoholic hepatitis.  *    small volume ascites.  *    Hyponatremia, sodium 133.  *    normocytic anemia.  *   Coagulopathy.  INR 1.6   PLAN:     *    AFP pending.  Ascites fluid studies pending.  Ordered stool for FOBT.   ANA, AMA, smooth muscle antibody, ceruloplasmin, IgG, alpha-1 antitrypsin.  *   Adding famotidine 20 mg bid for complaint of postprandial nausea.  *   ?MRCP to evaluate nonvisualized portal vein on ultrasound?  *   PO vitamin K for 3 days. 2 gm Na diet.     Azucena Freed  05/08/2021, 10:23 AM Phone [phone]

## 2021-05-08 NOTE — Procedures (Signed)
PROCEDURE SUMMARY:  Successful image-guided paracentesis from the right upper abdomen.  Yielded 1.4 liters of clear yellow fluid.  No immediate complications.  EBL < 1 mL Patient tolerated well.   Specimen was sent for labs.  Please see imaging section of Epic for full dictation.  Villa Herb PA-C 05/08/2021 11:05 AM

## 2021-05-09 DIAGNOSIS — D689 Coagulation defect, unspecified: Secondary | ICD-10-CM

## 2021-05-09 LAB — PROTIME-INR
INR: 1.6 — ABNORMAL HIGH (ref 0.8–1.2)
Prothrombin Time: 19.2 seconds — ABNORMAL HIGH (ref 11.4–15.2)

## 2021-05-09 LAB — C DIFFICILE QUICK SCREEN W PCR REFLEX
C Diff antigen: NEGATIVE
C Diff interpretation: NOT DETECTED
C Diff toxin: NEGATIVE

## 2021-05-09 LAB — CBC
HCT: 26.5 % — ABNORMAL LOW (ref 36.0–46.0)
Hemoglobin: 9 g/dL — ABNORMAL LOW (ref 12.0–15.0)
MCH: 33.5 pg (ref 26.0–34.0)
MCHC: 34 g/dL (ref 30.0–36.0)
MCV: 98.5 fL (ref 80.0–100.0)
Platelets: 165 10*3/uL (ref 150–400)
RBC: 2.69 MIL/uL — ABNORMAL LOW (ref 3.87–5.11)
RDW: 21.6 % — ABNORMAL HIGH (ref 11.5–15.5)
WBC: 7.9 10*3/uL (ref 4.0–10.5)
nRBC: 0 % (ref 0.0–0.2)

## 2021-05-09 LAB — CYTOLOGY - NON PAP

## 2021-05-09 LAB — AFP TUMOR MARKER: AFP, Serum, Tumor Marker: 3.7 ng/mL (ref 0.0–6.4)

## 2021-05-09 MED ORDER — SPIRONOLACTONE 25 MG PO TABS: 50.0000 mg | ORAL_TABLET | Freq: Every day | ORAL | 1 refills | Status: DC

## 2021-05-09 MED ORDER — THIAMINE HCL 100 MG PO TABS: 100.0000 mg | ORAL_TABLET | Freq: Every day | ORAL | Status: DC

## 2021-05-09 MED ORDER — FUROSEMIDE 20 MG PO TABS: 20.0000 mg | ORAL_TABLET | Freq: Every day | ORAL | 1 refills | Status: DC

## 2021-05-09 NOTE — Progress Notes (Signed)
Pt for DC to home, DC instructions given and reviewed.  Pt understands her meds and follow up appointments.

## 2021-05-09 NOTE — Discharge Instructions (Signed)
Alcoholic Liver Disease Alcoholic liver disease refers to liver damage that is caused by drinking a lot of alcohol over a long period of time. The liver is an organ that:  Helps to divide (break down) and absorb fats and other nutrients in the blood.  Helps to remove harmful substances from the blood.  Makes the parts of the blood that help to form clots and prevent excessive bleeding. Damage may cause the liver to stop working properly. There are three main types of alcoholic liver disease, and they usually occur in the following order: 1. Fatty liver disease. This is considered the early stage of alcoholic liver disease. It is a condition in which too much fat has built up in the liver cells. In many cases, fatty liver disease does not cause any symptoms. It is often diagnosed when lab tests are done for other reasons. 2. Alcoholic hepatitis. This is liver inflammation that decreases the liver's ability to function normally. 3. Alcoholic cirrhosis. Cirrhosis is long-term (chronic) liver injury. Having cirrhosis means that many healthy liver cells have been replaced by scar tissue. This prevents blood from flowing through the liver, which makes it difficult for the liver to function. Symptoms of this condition usually get worse (progress) over time if alcohol use continues. Treatment requires stopping all alcohol intake. What are the causes? Alcoholic liver disease is caused by chronic heavy alcohol use. The liver filters alcohol out of the bloodstream. When alcohol gets broken down in the liver, it releases poisonous (toxic) chemicals that damage liver cells. What increases the risk? This condition is more likely to develop in:  Women.  People who are obese.  People who have a family history of alcoholic liver disease.  People who regularly drink large amounts of alcohol in a short amount of time (binge drink).  People who have an underlying liver disease such as hepatitis B or  hepatitis C.  People with poor nutrition or who lack certain nutrients, such as folate or thiamine (have nutrient deficiencies). What are the signs or symptoms? Most people do not have symptoms in the early stages of this disease. If early symptoms do develop, they may include:  Loss of appetite.  Nausea and vomiting. Symptoms of moderate disease include:  Diarrhea.  Fever.  Feeling tired (fatigue).  Weight loss without trying.  Yellowing of the skin and the whites of the eyes (jaundice).  Pain and swelling in the abdomen. Symptoms of advanced disease include:  Weight loss and muscle loss.  Itchy skin.  Buildup of fluid in the abdomen (ascites).  Swelling of the feet and ankles (edema).  Nosebleeds or bleeding gums.  Trouble concentrating.  Mood changes or agitation.  Confusion. Some people do not have symptoms until the condition becomes advanced. Symptoms often get worse right after a period of heavy drinking. How is this diagnosed? This condition may be diagnosed based on:  A physical exam.  Blood tests.  Tests that create detailed images of the body. These may include: ? Liver ultrasound. ? CT scan. ? MRI.  A liver biopsy. For this test, a small sample of liver tissue is removed and checked for signs of damage. How is this treated? The most important part of treatment is to stop drinking alcohol. If you are addicted to alcohol, your health care provider will help you make a plan to quit. This plan may involve:  Taking medicine to decrease unpleasant symptoms that are caused by stopping or decreasing alcohol use (withdrawal symptoms).  Entering  a treatment program to help you stop drinking.  Joining a support group. Treatment for alcoholic liver disease may also include:  Nutritional therapy. Your health care provider or a dietitian may recommend: ? Eating a healthy diet. ? Taking vitamins. ? Eating foods that contain a lot of zinc or B vitamins  such as folate, thiamine, or pyridoxine.  Steroid medicines to reduce liver inflammation. These may be recommended if your disease is advanced.  Receiving a donated liver (liver transplant). This is only done in very severe cases and only for people who have completely stopped drinking and can commit to never drinking alcohol again. Follow these instructions at home:  Do not drink alcohol. Follow your treatment plan, and work with your health care provider as needed.  Consider joining an alcohol support group. These groups can provide emotional support and guidance.  Take over-the-counter and prescription medicines only as told by your health care provider. These include vitamins and supplements.  Do not use medicines or eat foods that contain alcohol unless told by your health care provider.  Follow instructions from your health care provider or dietitian about eating a healthy diet.  Keep all follow-up visits. This is important.   Where to find support  Alcoholics Anonymous: [employer] Contact a health care provider if:  You develop a fever or chills.  Your skin color becomes more yellow, pale, or dark.  You develop headaches. Get help right away if:  You vomit blood.  You have black, tarry stools, or bright red blood in your stool.  You have trouble: ? Thinking. ? Walking. ? Balancing. ? Breathing. These symptoms may represent a serious problem that is an emergency. Do not wait to see if the symptoms will go away. Get medical help right away. Call your local emergency services (911 in the [place] Do not drive yourself to the hospital. Summary  Alcoholic liver disease refers to liver damage that is caused by drinking a lot of alcohol over a long period of time.  Symptoms of this condition get worse (progress) over time if alcohol use continues.  Your health care provider will do a physical exam and tests to diagnose your condition.  The most important part of treatment is  to stop drinking alcohol. Follow your treatment plan, and work with your health care provider as needed. This information is not intended to replace advice given to you by your health care provider. Make sure you discuss any questions you have with your health care provider. Document Revised: 09/19/2020 Document Reviewed: 09/19/2020 Elsevier Patient Education  2021 [employer].

## 2021-05-09 NOTE — Discharge Summary (Signed)
DISCHARGE SUMMARY  Brandi James  MR#: [id number]  DOB:[birth year]  Date of Admission: 05/08/2021 Date of Discharge: 05/09/2021  Attending Physician:Eldonna Neuenfeldt Silvestre Gunner, MD  Patient's HCW:[id number], Kandis Cocking, PA-C  Consults: [facility]  Disposition: D/C home    Follow-up Appts:  Follow-up Information    Mayer Masker, PA-C Follow up in 1 week(s).   Specialty: Physician Assistant Contact information: [address] [place] [address] [phone]        [facility] Follow up in 1 month(s).   Specialty: Gastroenterology Contact information: [address] 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  Hospital Course:  Patient was admitted to the acute units for severe abdominal distention and pain.  She underwent a 1.4 L paracentesis in interventional radiology 5/19.  Following this she noted significant improvement in her symptoms.  She was able to eat without difficulty and was interested in being discharged home.  Paracentesis studies and clinical situation were not consistent with SBP.  The patient was evaluated by GI and extensively educated on the diagnosis of cirrhosis.  She was counseled by the GI team and the medicine team as to the absolute need to fully abstain from alcohol.  Aldactone and Lasix were newly initiated during this hospital stay.  Patient was educated on the fact that they could lead to electrolyte derangements and that she needed follow-up of  her blood chemistries in the outpatient setting.  She assured the discharging physician she was connected with a primary care provider and would not have difficulty arranging this on her own.  She was cleared for discharge but GI team and will follow-up in their office as per their direction.  She did not suffer with alcohol withdrawal during this hospital stay.     Allergies as of 05/09/2021      Reactions   Nsaids Other (See Comments)   H/o Roux-en-Y gastric bypass   Morphine And Related Other (See Comments)   Extreme mood swings      Medication List    TAKE these medications   escitalopram 5 MG tablet Commonly known as: Lexapro Take 1 tablet (5 mg total) by mouth daily.   furosemide 20 MG tablet Commonly known as: LASIX Take 1 tablet (20 mg total) by mouth daily. Start taking on: May 10, 2021   hydrOXYzine 25 MG tablet Commonly known as: ATARAX/VISTARIL Take 1 tablet (25 mg total) by mouth every 8 (eight) hours as needed for anxiety.   spironolactone 25 MG tablet Commonly known as: ALDACTONE Take 2 tablets (50 mg total) by mouth daily. Start taking on: May 10, 2021   thiamine 100 MG tablet Take 1 tablet (100 mg total) by mouth daily. Start taking on: May 10, 2021       Day of Discharge BP 107/73 (BP Location: Left Arm)   Pulse 95   Temp 99.5 F (37.5 C) (Oral)   Resp 20   Ht 5\' 5"  (1.651 m)   Wt 89.9 kg   SpO2 96%   BMI 32.98 kg/m   Physical Exam: General: No  acute respiratory distress Lungs: Clear to auscultation bilaterally without wheezes or crackles Cardiovascular: Regular rate and rhythm without murmur gallop or rub normal S1 and S2 Abdomen: Protuberant but soft, no rebound, no appreciable mass, bowel sounds positive Extremities: No significant cyanosis, clubbing, or edema bilateral lower extremities  Basic Metabolic Panel: Recent Labs  Lab 05/07/21 1319 05/08/21 0550 05/08/21 1343  NA 133* 133*  --   K 4.2 3.5  --   CL 98 99  --   CO2 25 27   --   GLUCOSE 106* 111*  --   BUN 2* <5*  --   CREATININE 0.42* 0.44  --   CALCIUM 8.5* 8.2*  --   MG  --   --  2.0  PHOS  --   --  4.2    Liver Function Tests: Recent Labs  Lab 05/07/21 1319 05/08/21 0550 05/08/21 0709  AST 158* 131* 128*  ALT 30 31 31   ALKPHOS 279* 191* 191*  BILITOT 9.3* 8.7* 8.7*  PROT 7.1 7.5 7.5  ALBUMIN 3.1* 2.2* 2.2*   Recent Labs  Lab 05/07/21 1319 05/08/21 0550  LIPASE 35 40    Coags: Recent Labs  Lab 05/08/21 0709 05/09/21 0214  INR 1.6* 1.6*    CBC: Recent Labs  Lab 05/07/21 1319 05/08/21 0550 05/09/21 0214  WBC 9.5 7.6 7.9  NEUTROABS 6.6 5.6  --   HGB 10.5* 9.6* 9.0*  HCT 29.0* 28.7* 26.5*  MCV 92 99.7 98.5  PLT 196 178 165     Recent Results (from the past 240 hour(s))  Gram stain     Status: None   Collection Time: 05/08/21 11:21 AM   Specimen: Abdomen; Peritoneal Fluid  Result Value Ref Range Status   Specimen Description PERITONEAL FLUID  Final   Special Requests ABDOMEN  Final   Gram Stain   Final    WBC PRESENT, PREDOMINANTLY MONONUCLEAR NO ORGANISMS SEEN CYTOSPIN SMEAR Performed at [facility], [address] [place], [place]    Report Status 05/08/2021 FINAL  Final  Culture, body fluid w Gram Stain-bottle     Status: None (Preliminary result)   Collection Time: 05/08/21 11:21 AM   Specimen: Peritoneal Washings  Result Value Ref Range Status   Specimen Description PERITONEAL FLUID  Final   Special Requests PERITONEAL FLUID  Final   Culture   Final    NO GROWTH < 24 HOURS Performed at [facility], [address] [place], [place]    Report Status PENDING  Incomplete  SARS CORONAVIRUS 2 (TAT 6-24 HRS) Nasopharyngeal Nasopharyngeal Swab     Status: None   Collection Time: 05/08/21 11:38 AM   Specimen: Nasopharyngeal Swab  Result Value Ref Range Status   SARS Coronavirus 2 NEGATIVE NEGATIVE Final    Comment: (NOTE) SARS-CoV-2 target nucleic acids are NOT  DETECTED.  The SARS-CoV-2 RNA is generally detectable in upper and lower respiratory specimens during the acute phase of infection. Negative results do not preclude SARS-CoV-2 infection, do not rule out co-infections with other pathogens, and should not be used as the sole basis for treatment or other patient management decisions. Negative results must be combined with clinical observations, patient history, and epidemiological information. The expected result is Negative.  Fact Sheet for Patients: 05/10/21  Fact Sheet for Healthcare Providers: HairSlick.no  This test is not yet approved or cleared by the quierodirigir.com FDA and  has been authorized for detection and/or diagnosis of SARS-CoV-2 by  FDA under an Emergency Use Authorization (EUA). This EUA will remain  in effect (meaning this test can be used) for the duration of the COVID-19 declaration under Se ction 564(b)(1) of the Act, 21 U.S.C. section 360bbb-3(b)(1), unless the authorization is terminated or revoked sooner.  Performed at [facility], [address], [place], [place] [address]   C Difficile Quick Screen w PCR reflex     Status: None   Collection Time: 05/08/21 10:06 PM   Specimen: STOOL  Result Value Ref Range Status   C Diff antigen NEGATIVE NEGATIVE Final   C Diff toxin NEGATIVE NEGATIVE Final   C Diff interpretation No C. difficile detected.  Final    Comment: Performed at [facility], [address] [place] [address], [place], [place] [address]      Time spent in discharge (includes decision making & examination of pt): 35 minutes  05/09/2021, 4:29 PM   Lonia Blood, MD [facility] Office  [phone]

## 2021-05-09 NOTE — Progress Notes (Signed)
Patient has orders on chart for surgical procedure in morning.Patient has no knowledge of any scheduled surgical procedure.This nurse had charge nurse Harriett Sine review patient chart. Primary nurse and charge nurse could not find a note in chart for surgical procedure.Will clarify in morning with MD.

## 2021-05-09 NOTE — Progress Notes (Addendum)
Paged Dr. Michaell Cowing. He called back. He said something must have messed up in Epic. The orders for a pt with him having surgery today were removed, and he has no idea how orders ended up on a pt at [facility] because he is at [facility]. (MD called back. He originally had orders for this pt to have procedure 6 weeks ago, and it didn't happen. Someone accidentally released orders from that.)  He said to check with General Surgery and then remove orders.  Checked with Carlena Bjornstad, PA. She reviewed chart and removed orders.

## 2021-05-09 NOTE — Progress Notes (Signed)
Daily Rounding Note  05/09/2021, 9:49 AM  LOS: 1 day   SUBJECTIVE:   Chief complaint:   New cirrhosis.  Abdominal swelling.   Alcohol abuse.   Felling well now that she washed hair and had a shower.   Eating well, 2 gm Na diet. Soft and formed brown stools.   Minor tachycardia in the low 100s.  Has not required Ativan for withdrawal symptom No dizziness  OBJECTIVE:         Vital signs in last 24 hours:    Temp:  [98.5 F (36.9 C)-99.9 F (37.7 C)] 99.9 F (37.7 C) (05/20 0559) Pulse Rate:  [93-105] 97 (05/20 0559) Resp:  [16-23] 22 (05/20 0559) BP: (97-133)/(70-86) 111/74 (05/20 0559) SpO2:  [94 %-96 %] 96 % (05/20 0559) Last BM Date: 05/08/21 Filed Weights   05/08/21 0541  Weight: 89.9 kg   General: Obese.  Comfortable.  Looks unwell.  Not toxic. Heart: RRR. Chest: Clear bilaterally without labored breathing or cough Abdomen: Distended, nontender.  Active bowel sounds Extremities: Slight lower extremity edema. Neuro/Psych: Alert.  Appropriate.  Oriented x3.  No tremors.  No asterixis.  Intake/Output from previous day: 05/19 0701 - 05/20 0700 In: 360 [P.O.:360] Out: -   Intake/Output this shift: Total I/O In: 240 [P.O.:240] Out: -   Lab Results: Recent Labs    05/07/21 1319 05/08/21 0550 05/09/21 0214  WBC 9.5 7.6 7.9  HGB 10.5* 9.6* 9.0*  HCT 29.0* 28.7* 26.5*  PLT 196 178 165   BMET Recent Labs    05/07/21 1319 05/08/21 0550  NA 133* 133*  K 4.2 3.5  CL 98 99  CO2 25 27  GLUCOSE 106* 111*  BUN 2* <5*  CREATININE 0.42* 0.44  CALCIUM 8.5* 8.2*   LFT Recent Labs    05/07/21 1319 05/08/21 0550 05/08/21 0709  PROT 7.1 7.5 7.5  ALBUMIN 3.1* 2.2* 2.2*  AST 158* 131* 128*  ALT 30 31 31   ALKPHOS 279* 191* 191*  BILITOT 9.3* 8.7* 8.7*  BILIDIR  --   --  4.7*  IBILI  --   --  4.0*   PT/INR Recent Labs    05/08/21 0709 05/09/21 0214  LABPROT 18.8* 19.2*  INR 1.6* 1.6*    Hepatitis Panel Recent Labs    05/08/21 0709  HEPBSAG NON REACTIVE  HCVAB NON REACTIVE  HEPAIGM NON REACTIVE  HEPBIGM NON REACTIVE    Studies/Results: 05/10/21 Abdomen Limited RUQ (LIVER/GB)  Result Date: 05/08/2021 CLINICAL DATA:  40 year old female with cirrhosis.  Ascites. EXAM: ULTRASOUND ABDOMEN LIMITED RIGHT UPPER QUADRANT COMPARISON:  MRI abdomen 04/08/2008. FINDINGS: Gallbladder: Reportedly surgically absent. Common bile duct: Diameter: Could not be visualized. Liver: Echogenic liver (image 12) with nodular liver contour. No discrete liver lesion. No intrahepatic biliary ductal dilatation. Portal vein could not be visualized. Other: Negative visible right kidney. Small volume perihepatic ascites (images 12, 23). Grayscale ultrasound images were also obtained in both gutters and lower quadrants. Small volume of ascites is present overall, primarily pooling in the right lower quadrant (image 30). Splenomegaly (length approximately 17 cm. IMPRESSION: 1. Cirrhosis with evidence of fatty liver disease. No discrete liver lesion. Portal vein and common bile duct could not be visualized by ultrasound. 2. Small volume of ascites overall, most abundant in the right lower quadrant. Electronically Signed   By: 04/10/2008 M.D.   On: 05/08/2021 10:06   IR Paracentesis  Result Date: 05/08/2021 INDICATION: Patient with history of  alcohol abuse, newly diagnosed cirrhosis who presented to the ED with complaints of worsening abdominal distension and jaundice. Request to IR for diagnostic and therapeutic paracentesis. EXAM: ULTRASOUND GUIDED DIAGNOSTIC AND THERAPEUTIC PARACENTESIS MEDICATIONS: 10 mL 1% lidocaine COMPLICATIONS: None immediate. PROCEDURE: Informed written consent was obtained from the patient after a discussion of the risks, benefits and alternatives to treatment. A timeout was performed prior to the initiation of the procedure. Initial ultrasound scanning demonstrates a small amount of ascites within  the right upper abdominal quadrant. The right upper abdomen was prepped and draped in the usual sterile fashion. 1% lidocaine was used for local anesthesia. Following this, a 19 gauge, 7-cm, Yueh catheter was introduced. An ultrasound image was saved for documentation purposes. The paracentesis was performed. The catheter was removed and a dressing was applied. The patient tolerated the procedure well without immediate post procedural complication. FINDINGS: A total of approximately 1.4 L of clear yellow fluid was removed. Samples were sent to the laboratory as requested by the clinical team. IMPRESSION: Successful ultrasound-guided paracentesis yielding 1.4 liters of peritoneal fluid. Read by Lynnette Caffey, PA-C Electronically Signed   By: Judie Petit.  Shick M.D.   On: 05/08/2021 11:31    ASSESMENT:   *  New diagnosis of cirrhosis, decompensated ETOH hepatitis.  DF 25.  MELD-Na 20.   Acute Hep serologies negative.  AFP 3.7.  HIV screening nonreactive. Alpha 1 antitrypsin, ceruloplasmin, IgG, smooth muscle antibody, mitochondrial antibodies, ANA all pending. LFTs improving.  Remains jaundiced w t bili 8.7.    *  Alcoholism, not in remission.  CIWA protocol in place.  Has not required available prn Ativan.  *   Ascites.  Abdominal distention. No SBP on 1.4 L paracentesis 5/19. Aldactone 50, Lasix 20 mg daily added, day 1  *   Hyponatremia.    *   [place] anemia.  No hx of bleeding, GI or otherwise.  Infrequent menses.    *   Coagulopathy.  Day 2/3 po Vit K.    *   Depression, anxiety.  Ongoing Lexapro.  *   Nausea.  Suspect due to alcohol.  Pepcid added 5/19 but may not need long-term acid suppression.     PLAN   *    Achieve and maintain complete alcohol abstinence.  Will benefit from counseling for alcoholism as well as her depression, anxiety. 2 g sodium diet.  *   Watch BP and electrolytes/renal function on new Aldactone, Lasix. CMET in AM.   ? Home in next 24 h?    Brandi James   05/09/2021, 9:49 AM Phone [phone]

## 2021-05-09 NOTE — Social Work (Addendum)
Pt declined ETOH resources.  Jimmy Picket, Theresia Majors, [place] Clinical Social Worker [phone]

## 2021-05-10 LAB — ALPHA-1-ANTITRYPSIN: A-1 Antitrypsin, Ser: 185 mg/dL (ref 101–187)

## 2021-05-10 LAB — IGG: IgG (Immunoglobin G), Serum: 1990 mg/dL — ABNORMAL HIGH (ref 586–1602)

## 2021-05-10 LAB — CERULOPLASMIN: Ceruloplasmin: 23.8 mg/dL (ref 19.0–39.0)

## 2021-05-11 LAB — ANTI-SMOOTH MUSCLE ANTIBODY, IGG: F-Actin IgG: 19 Units (ref 0–19)

## 2021-05-11 LAB — MITOCHONDRIAL ANTIBODIES: Mitochondrial M2 Ab, IgG: <20 Units (ref 0.0–20.0)

## 2021-05-12 LAB — ANTINUCLEAR ANTIBODIES, IFA: ANA Ab, IFA: NEGATIVE

## 2021-05-14 ENCOUNTER — Encounter: Payer: Self-pay | Admitting: Gastroenterology

## 2021-05-14 LAB — CULTURE, BODY FLUID W GRAM STAIN -BOTTLE

## 2021-05-16 ENCOUNTER — Other Ambulatory Visit: Payer: Self-pay | Admitting: Physician Assistant

## 2021-05-16 ENCOUNTER — Other Ambulatory Visit: Payer: 59

## 2021-05-16 ENCOUNTER — Other Ambulatory Visit: Payer: Self-pay

## 2021-05-16 DIAGNOSIS — Z0000 Encounter for general adult medical examination without abnormal findings: Secondary | ICD-10-CM

## 2021-05-17 ENCOUNTER — Telehealth: Payer: Self-pay | Admitting: Gastroenterology

## 2021-05-17 LAB — COMPREHENSIVE METABOLIC PANEL
ALT: 47 IU/L — ABNORMAL HIGH (ref 0–32)
AST: 166 IU/L — ABNORMAL HIGH (ref 0–40)
Albumin/Globulin Ratio: 0.7 — ABNORMAL LOW (ref 1.2–2.2)
Albumin: 2.9 g/dL — ABNORMAL LOW (ref 3.8–4.8)
Alkaline Phosphatase: 157 IU/L — ABNORMAL HIGH (ref 44–121)
BUN/Creatinine Ratio: 15 (ref 9–23)
BUN: 7 mg/dL (ref 6–24)
Bilirubin Total: 12.4 mg/dL (ref 0.0–1.2)
CO2: 22 mmol/L (ref 20–29)
Calcium: 8.1 mg/dL — ABNORMAL LOW (ref 8.7–10.2)
Chloride: 95 mmol/L — ABNORMAL LOW (ref 96–106)
Creatinine, Ser: 0.47 mg/dL — ABNORMAL LOW (ref 0.57–1.00)
Globulin, Total: 4.1 g/dL (ref 1.5–4.5)
Glucose: 96 mg/dL (ref 65–99)
Potassium: 4.1 mmol/L (ref 3.5–5.2)
Sodium: 133 mmol/L — ABNORMAL LOW (ref 134–144)
Total Protein: 7 g/dL (ref 6.0–8.5)
eGFR: 122 mL/min/{1.73_m2} (ref >59)

## 2021-05-17 LAB — CBC
Hematocrit: 26.3 % — ABNORMAL LOW (ref 34.0–46.6)
Hemoglobin: 9.5 g/dL — ABNORMAL LOW (ref 11.1–15.9)
MCH: 34.7 pg — ABNORMAL HIGH (ref 26.6–33.0)
MCHC: 36.1 g/dL — ABNORMAL HIGH (ref 31.5–35.7)
MCV: 96 fL (ref 79–97)
Platelets: 201 10*3/uL (ref 150–450)
RBC: 2.74 x10E6/uL — CL (ref 3.77–5.28)
RDW: 16.5 % — ABNORMAL HIGH (ref 11.7–15.4)
WBC: 8.1 10*3/uL (ref 3.4–10.8)

## 2021-05-17 NOTE — Telephone Encounter (Signed)
This patient called through the answering service after seeing her lab results on MyChart.  She was seen in the hospital by Dr. Orvan Falconer and discharged in the hospital just over a week ago for decompensated alcohol-related liver disease and alcohol hepatitis. She has small volume paracentesis in the hospital and was discharged on low-dose diuretics.  Bilirubin had stabilized at 8.7 prior to discharge.  Patient has felt fairly well last week and was able to go back to work.  She has not noticed any increased abdominal girth, is not unsteady on her feet, slurring her speech, or otherwise feeling unwell.  She has remained completely abstinent from alcohol.  Speech fluent and mentation normal on phone.  Labs resulted at 9 AM on 05/16/2021 shows stable renal function electrolytes  Alkaline phosphatase 157 (down from 191 on May 19), albumin 2.9, AST 166 (up from 128), ALT 47 (up from 31) and total bilirubin 12.4 (up from 8.7).   From talking to patient, she sounds well despite the increase in bilirubin.  She had concerned that it may have been from the diuretics, but I reassured her that would not be the case.  She needs close follow-up repeat labs in the morning on 05/20/2021 after the holiday.  Please put in orders for a CMP and PT/INR  Patient will call the office first thing that morning to be sure orders in so she can come by the lab.  In the meantime, if she develops increasing abdominal girth, confusion, unsteadiness or any other concerns, she should present to the emergency department.  Ellwood Dense, MD

## 2021-05-20 ENCOUNTER — Other Ambulatory Visit: Payer: Self-pay

## 2021-05-20 ENCOUNTER — Telehealth: Payer: Self-pay | Admitting: Gastroenterology

## 2021-05-20 ENCOUNTER — Other Ambulatory Visit (INDEPENDENT_AMBULATORY_CARE_PROVIDER_SITE_OTHER): Payer: 59

## 2021-05-20 DIAGNOSIS — D689 Coagulation defect, unspecified: Secondary | ICD-10-CM | POA: Diagnosis not present

## 2021-05-20 DIAGNOSIS — K7290 Hepatic failure, unspecified without coma: Secondary | ICD-10-CM | POA: Diagnosis not present

## 2021-05-20 DIAGNOSIS — K7460 Unspecified cirrhosis of liver: Secondary | ICD-10-CM | POA: Diagnosis not present

## 2021-05-20 LAB — COMPREHENSIVE METABOLIC PANEL
ALT: 47 U/L — ABNORMAL HIGH (ref 0–35)
AST: 144 U/L — ABNORMAL HIGH (ref 0–37)
Albumin: 2.9 g/dL — ABNORMAL LOW (ref 3.5–5.2)
Alkaline Phosphatase: 110 U/L (ref 39–117)
BUN: 8 mg/dL (ref 6–23)
CO2: 27 mEq/L (ref 19–32)
Calcium: 8.2 mg/dL — ABNORMAL LOW (ref 8.4–10.5)
Chloride: 93 mEq/L — ABNORMAL LOW (ref 96–112)
Creatinine, Ser: 0.54 mg/dL (ref 0.40–1.20)
GFR: 113.66 mL/min (ref >60.00)
Glucose, Bld: 99 mg/dL (ref 70–99)
Potassium: 3.7 mEq/L (ref 3.5–5.1)
Sodium: 128 mEq/L — ABNORMAL LOW (ref 135–145)
Total Bilirubin: 11.9 mg/dL — ABNORMAL HIGH (ref 0.2–1.2)
Total Protein: 6.9 g/dL (ref 6.0–8.3)

## 2021-05-20 LAB — PROTIME-INR
INR: 1.9 ratio — ABNORMAL HIGH (ref 0.8–1.0)
Prothrombin Time: 20.8 s — ABNORMAL HIGH (ref 9.6–13.1)

## 2021-05-20 NOTE — Telephone Encounter (Signed)
Called patient and let her know Dr. Myrtie Neither would like her to come into our lab today and get labs (CMP & PT/INR), she agreed.

## 2021-05-21 ENCOUNTER — Encounter: Payer: Self-pay | Admitting: Physician Assistant

## 2021-05-21 ENCOUNTER — Ambulatory Visit (INDEPENDENT_AMBULATORY_CARE_PROVIDER_SITE_OTHER): Payer: 59 | Admitting: Physician Assistant

## 2021-05-21 ENCOUNTER — Other Ambulatory Visit: Payer: Self-pay

## 2021-05-21 VITALS — BP 103/64 | HR 96 | Temp 98.4°F | Ht 65.0 in | Wt 193.2 lb

## 2021-05-21 DIAGNOSIS — K7290 Hepatic failure, unspecified without coma: Secondary | ICD-10-CM

## 2021-05-21 DIAGNOSIS — Z09 Encounter for follow-up examination after completed treatment for conditions other than malignant neoplasm: Secondary | ICD-10-CM

## 2021-05-21 DIAGNOSIS — F322 Major depressive disorder, single episode, severe without psychotic features: Secondary | ICD-10-CM

## 2021-05-21 DIAGNOSIS — F331 Major depressive disorder, recurrent, moderate: Secondary | ICD-10-CM

## 2021-05-21 DIAGNOSIS — D689 Coagulation defect, unspecified: Secondary | ICD-10-CM

## 2021-05-21 DIAGNOSIS — I10 Essential (primary) hypertension: Secondary | ICD-10-CM

## 2021-05-21 DIAGNOSIS — F411 Generalized anxiety disorder: Secondary | ICD-10-CM

## 2021-05-21 DIAGNOSIS — K7031 Alcoholic cirrhosis of liver with ascites: Secondary | ICD-10-CM

## 2021-05-21 DIAGNOSIS — R17 Unspecified jaundice: Secondary | ICD-10-CM

## 2021-05-21 DIAGNOSIS — K7460 Unspecified cirrhosis of liver: Secondary | ICD-10-CM

## 2021-05-21 MED ORDER — PREDNISOLONE 5 MG PO TABS: 40.0000 mg | ORAL_TABLET | Freq: Every day | ORAL | 0 refills | Status: DC

## 2021-05-21 NOTE — Progress Notes (Signed)
Established Patient Office Visit  Subjective:  Patient ID: Brandi James, female    DOB: 1981/09/25  Age: 40 y.o. MRN: [id number]  CC:  Chief Complaint  Patient presents with  . Follow-up    Mood  . Hospitalization Follow-up    HPI Brandi James presents for hospital follow up. Patient was scheduled for regular follow up visit and labs were obtained as a follow up from last visit, not due to recent hospitalization. Patient did not contact office to schedule hospital follow-up within 1 week as recommended during discharge. Appointment changed to hospital follow-up. Patient reports has been in contact with gastroenterologist and will start instruction of limiting fluid intake to 2000 ml. Reports medication compliance with Lasix and Spironolactone. States pharmacy has requested alternative steroid taper due to cost. Patient has stopped smoking and drinking. Denies alcohol withdrawal symptoms. Did not start thiamine supplement. States paracentesis helped with abdominal fluid retention and no longer feels short of breath. Denies chest pain, palpitations, dizziness or feeling lightheaded. Does feel tired. Has noticed retaining fluid around upper thighs but was drinking more fluids. Is monitoring sodium intake and limiting to 1000 mg. Reports has noticed some blood when sneezing and when wiping due to hemorrhoids, nothing severe.   Discharge summary: Tests Needing Follow-up: -check electrolytes and assess tolerance of new aldactone and lasix tx   Discharge Diagnoses: Decompensated cirrhosis of the liver due to alcohol Transaminitis/hyperbilirubinemia Coagulopathy Hyponatremia Normocytic anemia Anxiety disorder  Initial presentation: 40yo with a history of alcoholism, HTN, and anxiety/depression who presented to the ED with complaints of progressive severe abdominal swelling and yellowing of the eyes.  Hospital Course:  Patient was admitted to the acute units for severe abdominal  distention and pain.  She underwent a 1.4 L paracentesis in interventional radiology 5/19.  Following this she noted significant improvement in her symptoms.  She was able to eat without difficulty and was interested in being discharged home.  Paracentesis studies and clinical situation were not consistent with SBP.  The patient was evaluated by GI and extensively educated on the diagnosis of cirrhosis.  She was counseled by the GI team and the medicine team as to the absolute need to fully abstain from alcohol.  Aldactone and Lasix were newly initiated during this hospital stay.  Patient was educated on the fact that they could lead to electrolyte derangements and that she needed follow-up of her blood chemistries in the outpatient setting.  She assured the discharging physician she was connected with a primary care provider and would not have difficulty arranging this on her own.  She was cleared for discharge but GI team and will follow-up in their office as per their direction.  She did not suffer with alcohol withdrawal during this hospital stay.      Past Medical History:  Diagnosis Date  . Depression with anxiety   . Hypertension     01/20/2021  . Panic attacks   . Substance abuse ([place])    01/20/2021    Past Surgical History:  Procedure Laterality Date  . IR PARACENTESIS  05/08/2021  . LAPAROSCOPIC CHOLECYSTECTOMY  2009  . LAPAROSCOPIC GASTRIC BYPASS N/A 2003   [facility].  Dr Lise Auer  . PANNICULECTOMY  2004   To address excessive skin following weight loss    Family History  Problem Relation Age of Onset  . Healthy Mother   . Diabetes Father   . Hypertension Father     Social History   Socioeconomic History  .  Marital status: Single    Spouse name: Not on file  . Number of children: Not on file  . Years of education: Not on file  . Highest education level: Not on file  Occupational History  . Not on file  Tobacco Use  . Smoking status: Former Smoker    Packs/day:  0.50    Types: Cigarettes    Quit date: 05/07/2021    Years since quitting: 0.0  . Smokeless tobacco: Never Used  Vaping Use  . Vaping Use: Never used  Substance and Sexual Activity  . Alcohol use: Not Currently    Alcohol/week: 12.0 standard drinks    Types: 12 Glasses of wine per week    Comment: 2 bottles of wine daily *  . Drug use: No  . Sexual activity: Not Currently    Birth control/protection: None  Other Topics Concern  . Not on file  Social History Narrative  . Not on file   Social Determinants of Health   Financial Resource Strain: Not on file  Food Insecurity: Not on file  Transportation Needs: Not on file  Physical Activity: Not on file  Stress: Not on file  Social Connections: Not on file  Intimate Partner Violence: Not on file    Outpatient Medications Prior to Visit  Medication Sig Dispense Refill  . furosemide (LASIX) 20 MG tablet Take 1 tablet (20 mg total) by mouth daily. 30 tablet 1  . hydrOXYzine (ATARAX/VISTARIL) 25 MG tablet Take 1 tablet (25 mg total) by mouth every 8 (eight) hours as needed for anxiety. 60 tablet 0  . prednisoLONE 5 MG TABS tablet Take 8 tablets (40 mg total) by mouth daily for 14 days. 112 tablet 0  . spironolactone (ALDACTONE) 25 MG tablet Take 2 tablets (50 mg total) by mouth daily. 30 tablet 1  . escitalopram (LEXAPRO) 5 MG tablet Take 1 tablet (5 mg total) by mouth daily. 90 tablet 0  . thiamine 100 MG tablet Take 1 tablet (100 mg total) by mouth daily.     No facility-administered medications prior to visit.    Allergies  Allergen Reactions  . Nsaids Other (See Comments)    H/o Roux-en-Y gastric bypass  . Morphine And Related Other (See Comments)    Extreme mood swings     ROS Review of Systems Review of Systems:  A fourteen system review of systems was performed and found to be positive as per HPI.   Objective:    Physical Exam General:  Pleasant and cooperative, in no acute distress  Neuro:  Alert and  oriented,  extra-ocular muscles intact  HEENT:  Normocephalic, atraumatic,+ scleral icterus, neck supple Skin:  no gross rash, warm, pink. Cardiac:  RRR, S1 S2, no murmur  Respiratory:  ECTA B/L and A/P, Not using accessory muscles, speaking in full sentences- unlabored. Abdomen: Protuberant, +mild distension and tenderness, NBS Vascular:  Ext warm, no cyanosis apprec.; cap RF less 2 sec. +pitting edema  Psych:  No HI/SI, judgement and insight good, Euthymic mood. Full Affect.   BP 103/64   Pulse 96   Temp 98.4 F (36.9 C)   Ht _0  (1.651 m)   Wt 193 lb 3.2 oz (87.6 kg)   SpO2 97%   BMI 32.15 kg/m  Wt Readings from Last 3 Encounters:  05/21/21 193 lb 3.2 oz (87.6 kg)  05/08/21 198 lb 3.1 oz (89.9 kg)  01/21/21 198 lb 4.8 oz (89.9 kg)     Health Maintenance Due  Topic  Date Due  . COVID-19 Vaccine (1) Never done  . PAP SMEAR-Modifier  Never done  . TETANUS/TDAP  05/19/2021    There are no preventive care reminders to display for this patient.  Lab Results  Component Value Date   TSH 3.260 12/26/2020   Lab Results  Component Value Date   WBC 8.1 05/16/2021   HGB 9.5 (L) 05/16/2021   HCT 26.3 (L) 05/16/2021   MCV 96 05/16/2021   PLT 201 05/16/2021   Lab Results  Component Value Date   NA 128 (L) 05/20/2021   K 3.7 05/20/2021   CO2 27 05/20/2021   GLUCOSE 99 05/20/2021   BUN 8 05/20/2021   CREATININE 0.54 05/20/2021   BILITOT 11.9 (H) 05/20/2021   ALKPHOS 110 05/20/2021   AST 144 (H) 05/20/2021   ALT 47 (H) 05/20/2021   PROT 6.9 05/20/2021   ALBUMIN 2.9 (L) 05/20/2021   CALCIUM 8.2 (L) 05/20/2021   ANIONGAP 7 05/08/2021   EGFR 122 05/16/2021   GFR 113.66 05/20/2021   Lab Results  Component Value Date   CHOL 191 12/26/2020   Lab Results  Component Value Date   HDL 37 (L) 12/26/2020   Lab Results  Component Value Date   LDLCALC 132 (H) 12/26/2020   Lab Results  Component Value Date   TRIG 119 12/26/2020   Lab Results  Component Value Date    CHOLHDL 5.2 (H) 12/26/2020   Lab Results  Component Value Date   HGBA1C 5.5 12/26/2020      Assessment & Plan:   Problem List Items Addressed This Visit      Cardiovascular and Mediastinum   HTN, goal below 130/80     Other   Severe major depression without psychotic features ([place])   Generalized anxiety disorder   Hyperbilirubinemia    Other Visit Diagnoses    Hospital discharge follow-up    -  Primary   Jaundice       Alcoholic cirrhosis of liver with ascites (HCC)       Moderate episode of recurrent major depressive disorder [facility])         Hospital discharge follow-up, Alcoholic cirrhosis of liver with ascites, Jaundice: -Reviewed hospital notes, labs and imaging. -US abdomen limited RUQ: Cirrhosis with evidence of fatty liver diease, no discrete liver lesion. Small volume of ascites, most abundant in the RLQ. -Follow up with Gastroenterology as scheduled. -Continue diuretic therapy.  -Recommend to continue with monitoring sodium intake and limit fluid intake to 2000 mL to reduce risk of volume overload. -Continue tobacco and alcohol cessation. -Discussed labs obtained 05/16/21. -Advised thiamine was prescribed likely for prevention of encephalopathy for alcohol withdrawal syndrome, patient is past 5 days so no longer indicated.  HTN: -Diet controlled. -Recommend to continue low sodium diet. -Will continue to monitor.   GAD, Moderate episode of recurrent MDD: -Stable. -Continue current medication regimen. -Will continue to monitor.   No orders of the defined types were placed in this encounter.   Follow-up: Return in about 3 months (around 08/21/2021) for Mood, HTN.   Note:  This note was prepared with assistance of Dragon voice recognition software. Occasional wrong-word or sound-a-like substitutions may have occurred due to the inherent limitations of voice recognition software.  Lorrene Reid, PA-C

## 2021-05-21 NOTE — Patient Instructions (Signed)
Cirrhosis  Cirrhosis is long-term (chronic) liver injury. The liver is the body's largest internal organ, and it performs many functions. It converts food into energy, removes toxic material from the blood, makes important proteins, and absorbs necessary vitamins from food. In cirrhosis, healthy liver cells are replaced by scar tissue. This prevents blood from flowing through the liver and makes it difficult for the liver to complete its functions. What are the causes? Common causes of this condition are hepatitis C and long-term alcohol abuse. Other causes include:  Nonalcoholic fatty liver disease (NAFLD). This happens when fat is deposited in the liver by causes other than alcohol.  Hepatitis B infection.  Autoimmune hepatitis. In this condition, the body's defense system (immune system) mistakenly attacks the liver cells, causing inflammation.  Diseases that cause blockage of ducts inside the liver.  Inherited liver diseases, such as hemochromatosis. This is one of the most common inherited liver diseases. In this disease, deposits of iron collect in the liver and other organs.  Reactions to certain long-term medicines, such as amiodarone, a heart medicine.  Parasitic infections. These include schistosomiasis, which is caused by a flatworm.  Long-term contact to certain toxins. These toxins include certain organic solvents, such as toluene and chloroform. What increases the risk? You are more likely to develop this condition if:  You have certain types of viral hepatitis.  You abuse alcohol, especially if you are female.  You are overweight.  You use IV drugs and share needles.  You have unprotected sex with someone who has viral hepatitis. What are the signs or symptoms? You may not have any signs and symptoms at first. Symptoms may not develop until the damage to your liver starts to get worse. Early symptoms may include:  Weakness and tiredness (fatigue).  Changes in  sleep patterns or having trouble sleeping.  Itchiness.  Tenderness in the right-upper part of your abdomen.  Weight loss and muscle loss.  Nausea.  Loss of appetite. Later symptoms may include:  Fatigue or weakness that is getting worse.  Yellow skin and eyes (jaundice).  Buildup of fluid in the abdomen (ascites). You may notice that your clothes are tight around your waist.  Weight gain and swelling of the feet and ankles (edema).  Trouble breathing.  Easy bruising and bleeding.  Vomiting blood, or black or bloody stool.  Mental confusion. How is this diagnosed? Your health care provider may suspect cirrhosis based on your symptoms and medical history, especially if you have other medical conditions or a history of alcohol abuse. Your health care provider will do a physical exam to feel your liver and to check for signs of cirrhosis. Tests may include:  Blood tests to check: ? For hepatitis B or C. ? Kidney function. ? Liver function.  Imaging tests such as: ? MRI or CT scan to look for changes seen in advanced cirrhosis. ? Ultrasound to see if normal liver tissue is being replaced by scar tissue.  A procedure in which a long needle is used to take a sample of liver tissue to be checked in a lab (biopsy). Liver biopsy can confirm the diagnosis of cirrhosis. How is this treated? Treatment for this condition depends on how damaged your liver is and what caused the damage. It may include treating the symptoms of cirrhosis, or treating the underlying causes to slow the damage. Treatment may include:  Making lifestyle changes, such as: ? Eating a healthy diet. You may need to work with your health care   provider or a dietitian to develop an eating plan. ? Restricting salt intake. ? Maintaining a healthy weight. ? Not abusing drugs or alcohol.  Taking medicines to: ? Treat liver infections or other infections. ? Control itching. ? Reduce fluid buildup. ? Reduce certain  blood toxins. ? Reduce risk of bleeding from enlarged blood vessels in the stomach or esophagus (varices).  Liver transplant. In this procedure, a liver from a donor is used to replace your diseased liver. This is done if cirrhosis has caused liver failure. Other treatments and procedures may be done depending on the problems that you get from cirrhosis. Common problems include liver-related kidney failure (hepatorenal syndrome). Follow these instructions at home:  Take medicines only as told by your health care provider. Do not use medicines that are toxic to your liver. Ask your health care provider before taking any new medicines, including over-the-counter medicines such as NSAIDs.  Rest as needed.  Eat a well-balanced diet.  Limit your salt or water intake, if your health care provider asks you to do this.  Do not drink alcohol. This is especially important if you routinely take acetaminophen.  Keep all follow-up visits. This is important.   Contact a health care provider if you:  Have fatigue or weakness that is getting worse.  Develop swelling of the hands, feet, or legs, or a buildup of fluid in the abdomen (ascites).  Have a fever or chills.  Develop loss of appetite.  Have nausea or vomiting.  Develop jaundice.  Develop easy bruising or bleeding. Get help right away if you:  Vomit bright red blood or a material that looks like coffee grounds.  Have blood in your stools.  Notice that your stools appear black and tarry.  Become confused.  Have chest pain or trouble breathing. These symptoms may represent a serious problem that is an emergency. Do not wait to see if the symptoms will go away. Get medical help right away. Call your local emergency services (911 in the [place] Do not drive yourself to the hospital. Summary  Cirrhosis is chronic liver injury. Common causes are hepatitis C and long-term alcohol abuse.  Tests used to diagnose cirrhosis include blood  tests, imaging tests, and liver biopsy.  Treatment for this condition involves treating the underlying cause. Avoid alcohol, drugs, salt, and medicines that may damage your liver.  Get help right away if you vomit bright red blood or a material that looks like coffee grounds. This information is not intended to replace advice given to you by your health care provider. Make sure you discuss any questions you have with your health care provider. Document Revised: 09/19/2020 Document Reviewed: 09/19/2020 Elsevier Patient Education  2021 Elsevier Inc.  

## 2021-05-23 ENCOUNTER — Telehealth: Payer: Self-pay | Admitting: Gastroenterology

## 2021-05-23 ENCOUNTER — Telehealth: Payer: Self-pay

## 2021-05-23 ENCOUNTER — Other Ambulatory Visit: Payer: Self-pay | Admitting: Gastroenterology

## 2021-05-23 ENCOUNTER — Other Ambulatory Visit: Payer: Self-pay

## 2021-05-23 MED ORDER — SPIRONOLACTONE 25 MG PO TABS: 50.0000 mg | ORAL_TABLET | Freq: Every day | ORAL | 3 refills | Status: DC

## 2021-05-23 MED ORDER — PREDNISOLONE 15 MG/5ML PO SOLN: 40.0000 mg | Freq: Every day | ORAL | 0 refills | Status: DC

## 2021-05-23 MED ORDER — PREDNISOLONE 5 MG PO TABS: 40.0000 mg | ORAL_TABLET | Freq: Every day | ORAL | 0 refills | Status: AC

## 2021-05-23 NOTE — Telephone Encounter (Signed)
[facility] could not obtain prednisalone in pill form. Rx sent in for liquid form.  Patient notified. She stated she had rx transferred to [facility].  She needs a refill of her aldactone as well.  New rx sent. Rx for liquid prednisalone cancelled.

## 2021-05-23 NOTE — Telephone Encounter (Signed)
The prescription has been resent to the pharmacy.  The pt has been advised

## 2021-05-26 ENCOUNTER — Other Ambulatory Visit (INDEPENDENT_AMBULATORY_CARE_PROVIDER_SITE_OTHER): Payer: 59

## 2021-05-26 DIAGNOSIS — D689 Coagulation defect, unspecified: Secondary | ICD-10-CM

## 2021-05-26 DIAGNOSIS — K7460 Unspecified cirrhosis of liver: Secondary | ICD-10-CM

## 2021-05-26 DIAGNOSIS — K7290 Hepatic failure, unspecified without coma: Secondary | ICD-10-CM | POA: Diagnosis not present

## 2021-05-26 LAB — COMPREHENSIVE METABOLIC PANEL
ALT: 69 U/L — ABNORMAL HIGH (ref 0–35)
AST: 246 U/L — ABNORMAL HIGH (ref 0–37)
Albumin: 3 g/dL — ABNORMAL LOW (ref 3.5–5.2)
Alkaline Phosphatase: 126 U/L — ABNORMAL HIGH (ref 39–117)
BUN: 13 mg/dL (ref 6–23)
CO2: 27 mEq/L (ref 19–32)
Calcium: 8.6 mg/dL (ref 8.4–10.5)
Chloride: 98 mEq/L (ref 96–112)
Creatinine, Ser: 0.52 mg/dL (ref 0.40–1.20)
GFR: 114.69 mL/min (ref >60.00)
Glucose, Bld: 111 mg/dL — ABNORMAL HIGH (ref 70–99)
Potassium: 3.9 mEq/L (ref 3.5–5.1)
Sodium: 133 mEq/L — ABNORMAL LOW (ref 135–145)
Total Bilirubin: 7.7 mg/dL — ABNORMAL HIGH (ref 0.2–1.2)
Total Protein: 7.2 g/dL (ref 6.0–8.3)

## 2021-05-26 LAB — PROTIME-INR
INR: 1.8 ratio — ABNORMAL HIGH (ref 0.8–1.0)
Prothrombin Time: 19.7 s — ABNORMAL HIGH (ref 9.6–13.1)

## 2021-05-29 ENCOUNTER — Other Ambulatory Visit: Payer: Self-pay

## 2021-05-29 MED ORDER — PREDNISOLONE 15 MG/5ML PO SOLN: ORAL | 1 refills | Status: DC

## 2021-05-29 NOTE — Telephone Encounter (Signed)
Pt refill has been ordered, her last refill was only for 6 days and not the full 14 days.

## 2021-05-29 NOTE — Telephone Encounter (Signed)
Patient called is wondering if she should continue the liquid Prednisalone if so she needs a refill.

## 2021-05-29 NOTE — Telephone Encounter (Signed)
The patient needs a total 14 day course of prednisolone as originally ordered.  She is having repeat labs early next week, at which time Dr. Orvan Falconer will return to the office and decide on further treatment based on lab results.  - HD

## 2021-05-30 ENCOUNTER — Other Ambulatory Visit: Payer: Self-pay | Admitting: Physician Assistant

## 2021-05-30 DIAGNOSIS — F411 Generalized anxiety disorder: Secondary | ICD-10-CM

## 2021-06-11 ENCOUNTER — Ambulatory Visit: Payer: 59 | Admitting: Gastroenterology

## 2021-06-11 ENCOUNTER — Telehealth: Payer: Self-pay | Admitting: *Deleted

## 2021-06-11 ENCOUNTER — Other Ambulatory Visit: Payer: Self-pay | Admitting: *Deleted

## 2021-06-11 ENCOUNTER — Encounter: Payer: Self-pay | Admitting: Gastroenterology

## 2021-06-11 ENCOUNTER — Other Ambulatory Visit (INDEPENDENT_AMBULATORY_CARE_PROVIDER_SITE_OTHER): Payer: 59

## 2021-06-11 VITALS — BP 110/64 | HR 76 | Ht 65.0 in | Wt 168.4 lb

## 2021-06-11 DIAGNOSIS — Z23 Encounter for immunization: Secondary | ICD-10-CM

## 2021-06-11 DIAGNOSIS — K7031 Alcoholic cirrhosis of liver with ascites: Secondary | ICD-10-CM

## 2021-06-11 LAB — COMPREHENSIVE METABOLIC PANEL
ALT: 89 U/L — ABNORMAL HIGH (ref 0–35)
AST: 89 U/L — ABNORMAL HIGH (ref 0–37)
Albumin: 3.8 g/dL (ref 3.5–5.2)
Alkaline Phosphatase: 95 U/L (ref 39–117)
BUN: 9 mg/dL (ref 6–23)
CO2: 27 mEq/L (ref 19–32)
Calcium: 9.5 mg/dL (ref 8.4–10.5)
Chloride: 99 mEq/L (ref 96–112)
Creatinine, Ser: 0.5 mg/dL (ref 0.40–1.20)
GFR: 115.74 mL/min (ref >60.00)
Glucose, Bld: 144 mg/dL — ABNORMAL HIGH (ref 70–99)
Potassium: 3.8 mEq/L (ref 3.5–5.1)
Sodium: 134 mEq/L — ABNORMAL LOW (ref 135–145)
Total Bilirubin: 3.7 mg/dL — ABNORMAL HIGH (ref 0.2–1.2)
Total Protein: 7.8 g/dL (ref 6.0–8.3)

## 2021-06-11 LAB — PROTIME-INR
INR: 1.4 ratio — ABNORMAL HIGH (ref 0.8–1.0)
Prothrombin Time: 16.1 s — ABNORMAL HIGH (ref 9.6–13.1)

## 2021-06-11 MED ORDER — PREDNISOLONE 15 MG/5ML PO SOLN: ORAL | 1 refills | Status: AC

## 2021-06-11 MED ORDER — FUROSEMIDE 20 MG PO TABS: 20.0000 mg | ORAL_TABLET | Freq: Every day | ORAL | 11 refills | Status: DC

## 2021-06-11 NOTE — Patient Instructions (Signed)
If you are age 42 or older, your body mass index should be between 23-30. Your Body mass index is 28.02 kg/m. If this is out of the aforementioned range listed, please consider follow up with your Primary Care Provider.  If you are age 5 or younger, your body mass index should be between 19-25. Your Body mass index is 28.02 kg/m. If this is out of the aformentioned range listed, please consider follow up with your Primary Care Provider.   You have been scheduled for an endoscopy. Please follow written instructions given to you at your visit today. If you use inhalers (even only as needed), please bring them with you on the day of your procedure.   Your provider has requested that you go to the basement level for lab work before leaving today. Press "B" on the elevator. The lab is located at the first door on the left as you exit the elevator.   We have sent the following medications to your pharmacy for you to pick up at your convenience: Lasix 20 mg.   The [facility] providers would like to encourage you to use [facility] to communicate with providers for non-urgent requests or questions.  Due to long hold times on the telephone, sending your provider a message by [facility] may be a faster and more efficient way to get a response.  Please allow 48 business hours for a response.  Please remember that this is for non-urgent requests.   It was a pleasure to see you today!  Thank you for trusting me with your gastrointestinal care!     Doug Sou, PA-C

## 2021-06-11 NOTE — Progress Notes (Signed)
06/11/2021 ISSABELLE MCRANEY [id number] 16-Nov-1981   HISTORY OF PRESENT ILLNESS: This is a 40 year old female who was new to our service during her recent hospitalization for new diagnosis of cirrhosis.  She says that she essentially started not feeling well back in December.  Then in May she noticed that her eyes were turning yellow.  She began retaining fluid.  Underwent paracentesis with 1.4 L of fluid removed on 05/08/2021.  This was negative for SBP.  She is on Lasix 20 mg daily and spironolactone 50 mg daily.  She is down about 30 pounds.  She has been following a low-sodium diet.  She has been without alcohol for a month.  Discriminant function was high so she was treated with prednisolone for acute alcohol hepatitis; she is on her 14th day today.  Repeat labs earlier this month showed improvement in her numbers.  Please see consult note from Jennye Moccasin, PA-C, from 05/08/2021 for further details regarding her history.  Overall she is feeling much better.   Past Medical History:  Diagnosis Date   Depression with anxiety    Hypertension     01/20/2021   Liver disease    Panic attacks    Substance abuse ([facility])    01/20/2021   Past Surgical History:  Procedure Laterality Date   IR PARACENTESIS  05/08/2021   LAPAROSCOPIC CHOLECYSTECTOMY  2009   LAPAROSCOPIC GASTRIC BYPASS N/A 2003   [facility].  Dr Adele Dan   PANNICULECTOMY  2004   To address excessive skin following weight loss    reports that she quit smoking about 5 weeks ago. Her smoking use included cigarettes. She smoked an average of 0.50 packs per day. She has never used smokeless tobacco. She reports previous alcohol use of about 12.0 standard drinks of alcohol per week. She reports that she does not use drugs. family history includes Diabetes in her father; Healthy in her mother; Hypertension in her father. Allergies  Allergen Reactions   Nsaids Other (See Comments)    H/o Roux-en-Y gastric bypass   Morphine And Related  Other (See Comments)    Extreme mood swings       Outpatient Encounter Medications as of 06/11/2021  Medication Sig   escitalopram (LEXAPRO) 5 MG tablet TAKE 1 TABLET (5 MG TOTAL) BY MOUTH DAILY.   furosemide (LASIX) 20 MG tablet Take 1 tablet (20 mg total) by mouth daily.   hydrOXYzine (ATARAX/VISTARIL) 25 MG tablet Take 1 tablet (25 mg total) by mouth every 8 (eight) hours as needed for anxiety.   prednisoLONE (PRELONE) 15 MG/5ML SOLN SMARTSIG:13.3 Milliliter(s) By Mouth Every Morning   spironolactone (ALDACTONE) 25 MG tablet Take 2 tablets (50 mg total) by mouth daily.   [DISCONTINUED] gabapentin (NEURONTIN) 100 MG capsule Take 1 capsule (100 mg total) by mouth 3 (three) times daily. Start one capsule at night, then may add one capsule at dinner, then may add one capsule at breakfast   [DISCONTINUED] propranolol (INDERAL) 10 MG tablet TAKE 1 TABLET BY MOUTH 2 TIMES DAILY. PATIENT NEEDS TELEMEDICINE VISIT PRIOR TO ANY FURTHER REFILLS (Patient not taking: No sig reported)   [DISCONTINUED] thiamine 100 MG tablet Take 1 tablet (100 mg total) by mouth daily.   No facility-administered encounter medications on file as of 06/11/2021.     REVIEW OF SYSTEMS  : All other systems reviewed and negative except where noted in the History of Present Illness.   PHYSICAL EXAM: BP 110/64   Pulse 76   Ht  5\' 5"  (1.651 m)   Wt 168 lb 6.4 oz (76.4 kg)   LMP 03/20/2021 (Approximate)   BMI 28.02 kg/m  General: Well developed white female in no acute distress Head: Normocephalic and atraumatic Eyes:  Mild scleral icterus noted. Ears: Normal auditory acuity Lungs: Clear throughout to auscultation; no W/R/R. Heart: Regular rate and rhythm; no M/R/G. Abdomen: Soft, obese.  BS present.  Non-tender.  Liver edge was palpable. Musculoskeletal: Symmetrical with no gross deformities  Skin: No lesions on visible extremities Extremities: No edema  Neurological: Alert oriented x 4, grossly  non-focal Psychological:  Alert and cooperative. Normal mood and affect  ASSESSMENT AND PLAN: *  New diagnosis of cirrhosis, decompensated ETOH hepatitis.  DF 25.  MELD-Na 20 initially, likely less now with improving labs. Acute Hep serologies negative.  AFP 3.7.  HIV screening nonreactive. Alpha 1 antitrypsin, ceruloplasmin, IgG, smooth muscle antibody, mitochondrial antibodies, ANA all normal/negative except for isolated elevation of IgG. LFTs/labs improving.  Is on prednisolone, today is day 14.  Will complete 28 day course.     *  Alcoholism:  No ETOH in one month.   *   Ascites. No SBP on 1.4 L paracentesis 5/19. Aldactone 50, Lasix 20 mg daily.  Continue these doses for now as well as 2 gram sodium diet.   *   [place] anemia.  No hx of bleeding, GI or otherwise.   *   Coagulopathy:  INR was 1.8.  -Will complete 28-day course of prednisolone.  Refill sent to her pharmacy. -EGD for screening of esophageal varices.  Scheduled with Dr. 6/19.  The risks, benefits, and alternatives to EGD were discussed with the patient and she consents to proceed.  -Repeat CMP and PT/INR today. -Congratulated on her alcohol abstinence.  She should continue to remain completely abstinent. -Continue Lasix 20 mg daily and spironolactone 50 mg daily for now.  New Lasix prescription sent to her pharmacy. -Needs hepatitis A, hepatitis B, Pneumovax vaccinations.  We will start this today.   CC:  Orvan Falconer, PA-C

## 2021-06-11 NOTE — Telephone Encounter (Signed)
Patient informed and script sent to pharmacy.

## 2021-06-11 NOTE — Telephone Encounter (Signed)
-----   Message from Tressia Danas, MD sent at 06/11/2021  2:12 PM EDT ----- Repeat labs in 2 weeks, as well.  Thanks.  KLB ----- Message ----- From: Leta Baptist, PA-C Sent: 06/11/2021   1:34 PM EDT To: Mariane Duval, CMA, Tressia Danas, MD  Ok.  Thank you.  I thought that we usually do 28 days but Dr. Irving Burton note mentioned 14 so I just wanted to check.    Brandi James,  We please let her know that her labs are much improved.  Dr. Orvan Falconer would like her to complete a total of 28 days of the prednisolone, however, so would you please send another 14-day supply of that to her pharmacy?  ----- Message ----- From: Tressia Danas, MD Sent: 06/11/2021   1:23 PM EDT To: Princella Pellegrini Zehr, PA-C  Thanks for seeing her and for the follow-up. Labs do look better. I'd recommend that she complete 28 days total of the prednisolone.  KLB ----- Message ----- From: Leta Baptist, PA-C Sent: 06/11/2021   1:00 PM EDT To: Tressia Danas, MD  Patient of yours from her recent hospital stay.  I saw her in follow-up this morning.  New diagnosis of cirrhosis.  Her labs are looking much better.  Today was her last day of 14 course of prednisolone for acute alcohol hepatitis.  Her numbers are looking much better.  Are you okay with her discontinuing the prednisone with her great improvement in labs?  Thank you,  Jess  ----- Message ----- From: Interface, Lab In Three Zero One Sent: 06/11/2021  11:29 AM EDT To: Leta Baptist, PA-C

## 2021-06-12 ENCOUNTER — Encounter: Payer: Self-pay | Admitting: Gastroenterology

## 2021-06-12 DIAGNOSIS — K7031 Alcoholic cirrhosis of liver with ascites: Secondary | ICD-10-CM | POA: Insufficient documentation

## 2021-06-13 ENCOUNTER — Other Ambulatory Visit: Payer: Self-pay

## 2021-06-13 ENCOUNTER — Encounter: Payer: Self-pay | Admitting: Gastroenterology

## 2021-06-13 ENCOUNTER — Ambulatory Visit (AMBULATORY_SURGERY_CENTER): Payer: 59 | Admitting: Gastroenterology

## 2021-06-13 VITALS — BP 100/66 | HR 93 | Temp 98.4°F | Resp 28 | Ht 65.0 in | Wt 168.0 lb

## 2021-06-13 DIAGNOSIS — K7031 Alcoholic cirrhosis of liver with ascites: Secondary | ICD-10-CM | POA: Diagnosis present

## 2021-06-13 DIAGNOSIS — K766 Portal hypertension: Secondary | ICD-10-CM

## 2021-06-13 DIAGNOSIS — B3781 Candidal esophagitis: Secondary | ICD-10-CM

## 2021-06-13 DIAGNOSIS — K319 Disease of stomach and duodenum, unspecified: Secondary | ICD-10-CM | POA: Diagnosis not present

## 2021-06-13 MED ORDER — SODIUM CHLORIDE 0.9 % IV SOLN: 500.0000 mL | Freq: Once | INTRAVENOUS | Status: DC

## 2021-06-13 NOTE — Progress Notes (Signed)
Called to room to assist during endoscopic procedure.  Patient ID and intended procedure confirmed with present staff. Received instructions for my participation in the procedure from the performing physician.  

## 2021-06-13 NOTE — Progress Notes (Signed)
pt tolerated well. VSS. awake and to recovery. Report given to RN. Bite block left insitu to recovery. No trauma. 

## 2021-06-13 NOTE — Op Note (Signed)
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 The patient's tolerance of                            previous anesthesia was also reviewed. The risks                            and benefits of the procedure and the sedation                            options and risks were discussed with the patient.                            All questions were answered, and informed consent                            was obtained. Prior Anticoagulants: The patient has                            taken no previous anticoagulant or antiplatelet                            agents. ASA Grade Assessment: III - A patient with                            severe systemic disease. After reviewing the risks                            and benefits, the patient was deemed in                            satisfactory condition to undergo the procedure.  After obtaining informed consent, the endoscope was                            passed under direct vision. Throughout the                            procedure, the patient's blood pressure, pulse, and                            oxygen saturations were monitored continuously. The                            Endoscope was introduced through the mouth, and                            advanced to the third part of duodenum. The  upper                            GI endoscopy was accomplished without difficulty.                            The patient tolerated the procedure well. Scope In: Scope Out: Findings:                 Diffuse, white plaques were found in the entire                            esophagus. Most of these plaques would move with                            water suggesting the possibility of residual                            medications. Biopsies were taken with a cold                            forceps for histology. Estimated blood loss was                            minimal. No esophageal varices are present.                           Mild portal hypertensive gastropathy was found in                            the gastric body. No gastric varices.                           Evidence of a gastric bypass was found. A gastric                            pouch with a small size was found. The  gastrojejunal anastomosis was characterized by                            healthy appearing mucosa. This was traversed. The                            jejunojejunal anastomosis was characterized by                            healthy appearing mucosa.                           The examined jejunum was normal. Complications:            No immediate complications. Estimated blood loss:                            Minimal. Estimated Blood Loss:     Estimated blood loss was minimal. Impression:               - Abnormal esophagus. Biopsies for candidiasis                            although the findings may be related to recently                            consumed medications. Biopsied.                           - Portal hypertensive gastropathy. No gastric or                            esophageal varices.                           - Gastric bypass with a small-sized pouch.                           - Normal examined jejunum. Recommendation:           - Patient has a contact number  available for                            emergencies. The signs and symptoms of potential                            delayed complications were discussed with the                            patient. Return to normal activities tomorrow.                            Written discharge instructions were provided to the                            patient.                           -  Resume previous diet.                           - Continue present medications.                           - Await pathology results.                           - Repeat EGD for esophageal variceal screening per                            protocol. Tressia DanasKimberly Tennessee Perra MD, MD 06/13/2021 11:04:32 AM This report has been signed electronically.

## 2021-06-13 NOTE — Patient Instructions (Signed)
YOU HAD AN ENDOSCOPIC PROCEDURE TODAY AT THE [facility]:   Refer to the procedure report that was given to you for any specific questions about what was found during the examination.  If the procedure report does not answer your questions, please call your gastroenterologist to clarify.  If you requested that your care partner not be given the details of your procedure findings, then the procedure report has been included in a sealed envelope for you to review at your convenience later.  YOU SHOULD EXPECT: Some feelings of bloating in the abdomen. Passage of more gas than usual.  Walking can help get rid of the air that was put into your GI tract during the procedure and reduce the bloating. If you had a lower endoscopy (such as a colonoscopy or flexible sigmoidoscopy) you may notice spotting of blood in your stool or on the toilet paper. If you underwent a bowel prep for your procedure, you may not have a normal bowel movement for a few days.  Please Note:  You might notice some irritation and congestion in your nose or some drainage.  This is from the oxygen used during your procedure.  There is no need for concern and it should clear up in a day or so.  SYMPTOMS TO REPORT IMMEDIATELY:    Following upper endoscopy (EGD)  Vomiting of blood or coffee ground material  New chest pain or pain under the shoulder blades  Painful or persistently difficult swallowing  New shortness of breath  Fever of 100F or higher  Black, tarry-looking stools  For urgent or emergent issues, a gastroenterologist can be reached at any hour by calling [phone]. Do not use MyChart messaging for urgent concerns.    DIET:  We do recommend a small meal at first, but then you may proceed to your regular diet.  Drink plenty of fluids but you should avoid alcoholic beverages for 24 hours.  ACTIVITY:  You should plan to take it easy for the rest of today and you should NOT DRIVE or use heavy machinery  until tomorrow (because of the sedation medicines used during the test).    FOLLOW UP: Our staff will call the number listed on your records 48-72 hours following your procedure to check on you and address any questions or concerns that you may have regarding the information given to you following your procedure. If we do not reach you, we will leave a message.  We will attempt to reach you two times.  During this call, we will ask if you have developed any symptoms of COVID 19. If you develop any symptoms (ie: fever, flu-like symptoms, shortness of breath, cough etc.) before then, please call [phone].  If you test positive for Covid 19 in the 2 weeks post procedure, please call and report this information to us.    If any biopsies were taken you will be contacted by phone or by letter within the next 1-3 weeks.  Please call us at [phone] if you have not heard about the biopsies in 3 weeks.    SIGNATURES/CONFIDENTIALITY: You and/or your care partner have signed paperwork which will be entered into your electronic medical record.  These signatures attest to the fact that that the information above on your After Visit Summary has been reviewed and is understood.  Full responsibility of the confidentiality of this discharge information lies with you and/or your care-partner. 

## 2021-06-17 ENCOUNTER — Telehealth: Payer: Self-pay | Admitting: *Deleted

## 2021-06-17 NOTE — Telephone Encounter (Signed)
  Follow up Call-  Call back number 06/13/2021  Post procedure Call Back phone  # [phone]  Permission to leave phone message Yes  Some recent data might be hidden     Patient questions:  Do you have a fever, pain , or abdominal swelling? No. Pain Score  0 *  Have you tolerated food without any problems? Yes.    Have you been able to return to your normal activities? Yes.    Do you have any questions about your discharge instructions: Diet   No. Medications  No. Follow up visit  No.  Do you have questions or concerns about your Care? No.  Actions: * If pain score is 4 or above: No action needed, pain <4.

## 2021-06-20 ENCOUNTER — Other Ambulatory Visit: Payer: Self-pay

## 2021-06-20 MED ORDER — FLUCONAZOLE 100 MG PO TABS: ORAL_TABLET | ORAL | 0 refills | Status: DC

## 2021-06-25 NOTE — Progress Notes (Signed)
Reviewed and agree with management plans. Please schedule office follow-up with me in 3 months.   Riya Huxford L. Orvan Falconer, MD, MPH

## 2021-06-25 NOTE — Progress Notes (Signed)
Note sent to scheduling to have appt scheduled.

## 2021-07-11 DIAGNOSIS — Z23 Encounter for immunization: Secondary | ICD-10-CM

## 2021-07-22 ENCOUNTER — Encounter: Payer: Self-pay | Admitting: *Deleted

## 2021-08-01 ENCOUNTER — Telehealth: Payer: Self-pay | Admitting: Gastroenterology

## 2021-08-01 ENCOUNTER — Telehealth (INDEPENDENT_AMBULATORY_CARE_PROVIDER_SITE_OTHER): Payer: 59

## 2021-08-01 DIAGNOSIS — Z23 Encounter for immunization: Secondary | ICD-10-CM | POA: Diagnosis not present

## 2021-08-01 NOTE — Telephone Encounter (Signed)
Pt not due for 3rd twinrix vaccine until 01/11/22. Pt aware and reminder in epic.

## 2021-08-21 NOTE — Patient Instructions (Signed)

## 2021-08-21 NOTE — Progress Notes (Signed)
Established Patient Office Visit  Subjective:  Patient ID: Brandi James, female    DOB: 1981-01-07  Age: 40 y.o. MRN: [id number]  CC:  Chief Complaint  Patient presents with   Follow-up    Mood   Hypertension   HPI: Follow up on hypertension and mood management. States mood has been stable and taking medication as directed without issues. Denies mood changes or SI/HI. Patient reports neuropathy of both feet seems to be getting worse. In the past tried Gabapentin but did not give medication enough time to work. Reports extended hours on her feet worsen the pain. Feels burning and tingling sensation especially at the bottom of her foot. Patient continues to abstain from alcohol and tobacco use. If followed by Gastroenterology for cirrhosis.   Hypertension, follow-up  BP Readings from Last 3 Encounters:  08/26/21 108/71  06/13/21 100/66  06/11/21 110/64   Wt Readings from Last 3 Encounters:  08/26/21 161 lb 9.6 oz (73.3 kg)  06/13/21 168 lb (76.2 kg)  06/11/21 168 lb 6.4 oz (76.4 kg)     She was last seen for hypertension 3 months ago.  BP at that visit was 110/64. Management since that visit includes diet controlled.  She reports good compliance with treatment. She is not having side effects. She is following a Low Sodium diet. She is exercising. She does not smoke.  Use of agents associated with hypertension: none.   Symptoms: No chest pain No chest pressure  No palpitations No syncope  No dyspnea No orthopnea  No paroxysmal nocturnal dyspnea No lower extremity edema   Patient states she feels having shortness of breath not from her hypertension but fatigue. Feels she gets out of breath easily.  Pertinent labs: Lab Results  Component Value Date   CHOL 191 12/26/2020   HDL 37 (L) 12/26/2020   LDLCALC 132 (H) 12/26/2020   TRIG 119 12/26/2020   CHOLHDL 5.2 (H) 12/26/2020   Lab Results  Component Value Date   NA 134 (L) 06/11/2021   K 3.8 06/11/2021   CREATININE  0.50 06/11/2021   GFRNONAA >60 05/08/2021   GFRAA 132 12/26/2020   GLUCOSE 144 (H) 06/11/2021     The 10-year ASCVD risk score Mikey Bussing DC Jr., et al., 2013) is: 1.2%   ---------------------------------------------------------------------------------------------------    Past Medical History:  Diagnosis Date   Depression with anxiety    Hypertension     01/20/2021   Liver disease    Panic attacks    Substance abuse ([place])    01/20/2021    Past Surgical History:  Procedure Laterality Date   IR PARACENTESIS  05/08/2021   LAPAROSCOPIC CHOLECYSTECTOMY  2009   LAPAROSCOPIC GASTRIC BYPASS N/A 2003   [place].  Dr Lise Auer   PANNICULECTOMY  2004   To address excessive skin following weight loss    Family History  Problem Relation Age of Onset   Healthy Mother    Diabetes Father    Hypertension Father    Colon cancer Neg Hx    Rectal cancer Neg Hx    Stomach cancer Neg Hx    Esophageal cancer Neg Hx     Social History   Socioeconomic History   Marital status: Single    Spouse name: Not on file   Number of children: Not on file   Years of education: Not on file   Highest education level: Not on file  Occupational History   Not on file  Tobacco Use   Smoking status:  Former    Packs/day: 0.50    Types: Cigarettes    Quit date: 05/07/2021    Years since quitting: 0.3   Smokeless tobacco: Never  Vaping Use   Vaping Use: Never used  Substance and Sexual Activity   Alcohol use: Not Currently    Alcohol/week: 12.0 standard drinks    Types: 12 Glasses of wine per week    Comment: 2 bottles of wine daily *   Drug use: No   Sexual activity: Not Currently    Birth control/protection: None  Other Topics Concern   Not on file  Social History Narrative   Not on file   Social Determinants of Health   Financial Resource Strain: Not on file  Food Insecurity: Not on file  Transportation Needs: Not on file  Physical Activity: Not on file  Stress: Not on file  Social  Connections: Not on file  Intimate Partner Violence: Not on file    Outpatient Medications Prior to Visit  Medication Sig Dispense Refill   furosemide (LASIX) 20 MG tablet Take 1 tablet (20 mg total) by mouth daily. 90 tablet 11   hydrOXYzine (ATARAX/VISTARIL) 25 MG tablet Take 1 tablet (25 mg total) by mouth every 8 (eight) hours as needed for anxiety. 60 tablet 0   spironolactone (ALDACTONE) 25 MG tablet Take 2 tablets (50 mg total) by mouth daily. 60 tablet 3   escitalopram (LEXAPRO) 5 MG tablet TAKE 1 TABLET (5 MG TOTAL) BY MOUTH DAILY. 90 tablet 0   fluconazole (DIFLUCAN) 100 MG tablet Take 2 tabs on day 1 then 1 tab daily for 21 days 23 tablet 0   No facility-administered medications prior to visit.    Allergies  Allergen Reactions   Nsaids Other (See Comments)    H/o Roux-en-Y gastric bypass   Morphine And Related Other (See Comments)    Extreme mood swings     ROS Review of Systems Review of Systems:  A fourteen system review of systems was performed and found to be positive as per HPI.   Objective:    Physical Exam General:  Well Developed, well nourished, appropriate for stated age.  Neuro:  Alert and oriented,  extra-ocular muscles intact  HEENT:  Normocephalic, atraumatic, neck supple Skin:  no gross rash, warm, pink. Cardiac:  RRR, S1 S2, +systolic murmur Respiratory:  CTA B/L, Not using accessory muscles, speaking in full sentences- unlabored. Vascular:  Ext warm, no cyanosis apprec.; cap RF less 2 sec. Psych:  No HI/SI, judgement and insight good, Euthymic mood. Full Affect.  BP 108/71   Pulse 86   Temp 98.8 F (37.1 C)   Ht '5\' 5"'  (1.651 m)   Wt 161 lb 9.6 oz (73.3 kg)   SpO2 99%   BMI 26.89 kg/m  Wt Readings from Last 3 Encounters:  08/26/21 161 lb 9.6 oz (73.3 kg)  06/13/21 168 lb (76.2 kg)  06/11/21 168 lb 6.4 oz (76.4 kg)     Health Maintenance Due  Topic Date Due   COVID-19 Vaccine (1) Never done   PAP SMEAR-Modifier  Never done    TETANUS/TDAP  05/19/2021    There are no preventive care reminders to display for this patient.  Lab Results  Component Value Date   TSH 3.260 12/26/2020   Lab Results  Component Value Date   WBC 8.1 05/16/2021   HGB 9.5 (L) 05/16/2021   HCT 26.3 (L) 05/16/2021   MCV 96 05/16/2021   PLT 201 05/16/2021   Lab  Results  Component Value Date   NA 134 (L) 06/11/2021   K 3.8 06/11/2021   CO2 27 06/11/2021   GLUCOSE 144 (H) 06/11/2021   BUN 9 06/11/2021   CREATININE 0.50 06/11/2021   BILITOT 3.7 (H) 06/11/2021   ALKPHOS 95 06/11/2021   AST 89 (H) 06/11/2021   ALT 89 (H) 06/11/2021   PROT 7.8 06/11/2021   ALBUMIN 3.8 06/11/2021   CALCIUM 9.5 06/11/2021   ANIONGAP 7 05/08/2021   EGFR 122 05/16/2021   GFR 115.74 06/11/2021   Lab Results  Component Value Date   CHOL 191 12/26/2020   Lab Results  Component Value Date   HDL 37 (L) 12/26/2020   Lab Results  Component Value Date   LDLCALC 132 (H) 12/26/2020   Lab Results  Component Value Date   TRIG 119 12/26/2020   Lab Results  Component Value Date   CHOLHDL 5.2 (H) 12/26/2020   Lab Results  Component Value Date   HGBA1C 5.5 12/26/2020   PHQ9 SCORE ONLY 08/26/2021 05/21/2021 01/21/2021  PHQ-9 Total Score '8 11 8   ' GAD 7 : Generalized Anxiety Score 08/26/2021 05/21/2021  Nervous, Anxious, on Edge 1 1  Control/stop worrying 0 -  Worry too much - different things 0 2  Trouble relaxing 1 2  Restless 0 1  Easily annoyed or irritable 1 1  Afraid - awful might happen 0 1  Total GAD 7 Score 3 -        Assessment & Plan:   Problem List Items Addressed This Visit       Cardiovascular and Mediastinum   HTN, goal below 130/80 - Primary    -Controlled. 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 -Recommend to continue monitoring sodium intake. -Unable to collect CMP, advised to schedule lab visit.      Relevant Orders   Comp Met (CMET)     Hematopoietic and Hemostatic    Coagulopathy ([place])    -Reviewed last gastroenterology consult and labs, patient failed to obtain 2 week lab repeat from 06/11/2021 for PT/INR and CMP, advised to schedule lab visit.      Relevant Orders   Protime-INR     Other   Generalized anxiety disorder   Other Visit Diagnoses     Flu vaccine need       Relevant Orders   Flu Vaccine QUAD 6+ mos PF IM (Fluarix Quad PF) (Completed)   Moderate episode of recurrent major depressive disorder (H[facility])       Neuropathy       Relevant Medications   gabapentin (NEURONTIN) 100 MG capsule   Other Relevant Orders   B12 and Folate Panel   Heart murmur       Relevant Orders   ECHOCARDIOGRAM COMPLETE      Moderate episode of recurrent major depressive disorder: -Stable. Patient's baseline. Denies SI/HI. -Continue current medication regimen. -Will continue to monitor.  Neuropathy: -Uncontrolled. Discussed with patient treatment options and wants to re-trial Gabapentin. Discussed potential side effects including drowsiness and recommend to take first dose before bedtime. Will place lab orders to evaluate for nutritional deficiency possibly contributing to symptoms.  Heart murmur: -New systolic murmur is suspected on auscultation. Will place order for echocardiogram for further evaluation. Patient asymptomatic.   Meds ordered this encounter  Medications   gabapentin (NEURONTIN) 100 MG capsule    Sig: Take 1 capsule (100 mg total) by mouth 3 (three) times daily.    Dispense:  180 capsule  Refill:  0    Order Specific Question:   Supervising Provider    Answer:   Beatrice Lecher D [[id number]]    Follow-up: Return in about 8 weeks (around 10/21/2021) for Neuropathy- started med.

## 2021-08-26 ENCOUNTER — Other Ambulatory Visit: Payer: Self-pay

## 2021-08-26 ENCOUNTER — Encounter: Payer: Self-pay | Admitting: Physician Assistant

## 2021-08-26 ENCOUNTER — Ambulatory Visit (INDEPENDENT_AMBULATORY_CARE_PROVIDER_SITE_OTHER): Payer: 59 | Admitting: Physician Assistant

## 2021-08-26 VITALS — BP 108/71 | HR 86 | Temp 98.8°F | Ht 65.0 in | Wt 161.6 lb

## 2021-08-26 DIAGNOSIS — F411 Generalized anxiety disorder: Secondary | ICD-10-CM | POA: Diagnosis not present

## 2021-08-26 DIAGNOSIS — D689 Coagulation defect, unspecified: Secondary | ICD-10-CM

## 2021-08-26 DIAGNOSIS — R011 Cardiac murmur, unspecified: Secondary | ICD-10-CM

## 2021-08-26 DIAGNOSIS — Z23 Encounter for immunization: Secondary | ICD-10-CM

## 2021-08-26 DIAGNOSIS — F331 Major depressive disorder, recurrent, moderate: Secondary | ICD-10-CM

## 2021-08-26 DIAGNOSIS — I10 Essential (primary) hypertension: Secondary | ICD-10-CM

## 2021-08-26 DIAGNOSIS — G629 Polyneuropathy, unspecified: Secondary | ICD-10-CM

## 2021-08-26 MED ORDER — GABAPENTIN 100 MG PO CAPS: 100.0000 mg | ORAL_CAPSULE | Freq: Three times a day (TID) | ORAL | 0 refills | Status: DC

## 2021-08-26 NOTE — Assessment & Plan Note (Signed)
-  Controlled. 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 -Recommend to continue monitoring sodium intake. -Unable to collect CMP, advised to schedule lab visit.

## 2021-08-26 NOTE — Assessment & Plan Note (Signed)
-  Reviewed last gastroenterology consult and labs, patient failed to obtain 2 week lab repeat from 06/11/2021 for PT/INR and CMP, advised to schedule lab visit.

## 2021-08-26 NOTE — Assessment & Plan Note (Signed)
-  Stable.  -GAD-7 score of 3, PHQ-9 score of 8 (patient's baseline). -Continue current medication regimen.

## 2021-09-02 ENCOUNTER — Other Ambulatory Visit: Payer: Self-pay

## 2021-09-02 DIAGNOSIS — I10 Essential (primary) hypertension: Secondary | ICD-10-CM

## 2021-09-03 ENCOUNTER — Other Ambulatory Visit: Payer: 59

## 2021-09-03 ENCOUNTER — Other Ambulatory Visit: Payer: Self-pay

## 2021-09-03 DIAGNOSIS — I10 Essential (primary) hypertension: Secondary | ICD-10-CM

## 2021-09-04 LAB — COMPREHENSIVE METABOLIC PANEL
ALT: 20 IU/L (ref 0–32)
AST: 37 IU/L (ref 0–40)
Albumin/Globulin Ratio: 1.3 (ref 1.2–2.2)
Albumin: 4.1 g/dL (ref 3.8–4.8)
Alkaline Phosphatase: 84 IU/L (ref 44–121)
BUN/Creatinine Ratio: 17 (ref 9–23)
BUN: 11 mg/dL (ref 6–24)
Bilirubin Total: 1.1 mg/dL (ref 0.0–1.2)
CO2: 23 mmol/L (ref 20–29)
Calcium: 9.1 mg/dL (ref 8.7–10.2)
Chloride: 99 mmol/L (ref 96–106)
Creatinine, Ser: 0.66 mg/dL (ref 0.57–1.00)
Globulin, Total: 3.2 g/dL (ref 1.5–4.5)
Glucose: 90 mg/dL (ref 65–99)
Potassium: 4.2 mmol/L (ref 3.5–5.2)
Sodium: 136 mmol/L (ref 134–144)
Total Protein: 7.3 g/dL (ref 6.0–8.5)
eGFR: 112 mL/min/{1.73_m2} (ref >59)

## 2021-09-04 LAB — PROTIME-INR
INR: 1.2 (ref 0.9–1.2)
Prothrombin Time: 13 s — ABNORMAL HIGH (ref 9.1–12.0)

## 2021-09-04 LAB — B12 AND FOLATE PANEL
Folate: 15.6 ng/mL (ref >3.0)
Vitamin B-12: 608 pg/mL (ref 232–1245)

## 2021-09-06 ENCOUNTER — Other Ambulatory Visit: Payer: Self-pay | Admitting: Physician Assistant

## 2021-09-06 DIAGNOSIS — F411 Generalized anxiety disorder: Secondary | ICD-10-CM

## 2021-09-17 ENCOUNTER — Encounter: Payer: Self-pay | Admitting: General Surgery

## 2021-09-18 ENCOUNTER — Other Ambulatory Visit (HOSPITAL_BASED_OUTPATIENT_CLINIC_OR_DEPARTMENT_OTHER): Payer: 59

## 2021-09-24 ENCOUNTER — Other Ambulatory Visit: Payer: Self-pay

## 2021-09-24 ENCOUNTER — Ambulatory Visit (HOSPITAL_COMMUNITY): Payer: 59 | Attending: Cardiovascular Disease

## 2021-09-24 DIAGNOSIS — R011 Cardiac murmur, unspecified: Secondary | ICD-10-CM | POA: Insufficient documentation

## 2021-09-24 LAB — ECHOCARDIOGRAM COMPLETE
Area-P 1/2: 3.27 cm2
S' Lateral: 3.5 cm

## 2021-10-18 ENCOUNTER — Other Ambulatory Visit: Payer: Self-pay | Admitting: Gastroenterology

## 2021-10-20 ENCOUNTER — Telehealth: Payer: Self-pay

## 2021-10-20 DIAGNOSIS — K7031 Alcoholic cirrhosis of liver with ascites: Secondary | ICD-10-CM

## 2021-10-20 NOTE — Telephone Encounter (Signed)
Patient call back to schedule [place].

## 2021-10-20 NOTE — Telephone Encounter (Signed)
Pt is scheduled for [place] 10/30/21 @ 8am and f/u with Dr. Orvan Falconer 11/21/21 @ 320pm.

## 2021-10-20 NOTE — Telephone Encounter (Signed)
Received refill request from pharmacy for Spironolactone. Following response received from Dr. Orvan Falconer:  Refill okay. However, she is overdue clinic follow-up. Should have been in September. Please schedule office follow-up with me and an ultrasound to screen for hepatocellular carcinoma prior to her next appointment.   Thanks.   KLB   Called pt to make her aware. Pt agreeable to plan but states she will need to call back to schedule appt and to schedule [place].   Following message sent to Lesly Rubenstein and April Pait:  RADIOLOGY Wilkie Aye  [facility] Phone: [phone] Fax: [phone]   Imaging Ordered: Abd [place]  Diagnosis: Cirrhosis  Ordering Provider: Dr. Orvan Falconer  Is a Prior Authorization needed? We are in the process of obtaining it now  Is the patient Diabetic? No  Does the patient have Hypertension? Yes  Does the patient have any implanted devices or hardware? No  Date of last BUN/Creat, if needed? N/A  Patient Weight? 161#  Is the patient able to get on the table? Yes  Has the patient been diagnosed with COVID? No  Is the patient waiting on COVID testing results? No  Thank you for your assistance! [facility] Team

## 2021-10-30 ENCOUNTER — Ambulatory Visit (HOSPITAL_COMMUNITY)
Admission: RE | Admit: 2021-10-30 | Discharge: 2021-10-30 | Disposition: A | Discharge status: Home / Self Care | Payer: 59 | Source: Ambulatory Visit | Attending: Gastroenterology | Admitting: Gastroenterology

## 2021-10-30 ENCOUNTER — Other Ambulatory Visit: Payer: Self-pay

## 2021-10-30 DIAGNOSIS — K7031 Alcoholic cirrhosis of liver with ascites: Secondary | ICD-10-CM | POA: Diagnosis present

## 2021-11-21 ENCOUNTER — Ambulatory Visit (INDEPENDENT_AMBULATORY_CARE_PROVIDER_SITE_OTHER): Payer: 59 | Admitting: Gastroenterology

## 2021-11-21 ENCOUNTER — Encounter: Payer: Self-pay | Admitting: Gastroenterology

## 2021-11-21 VITALS — BP 112/60 | HR 79 | Ht 65.0 in | Wt 160.4 lb

## 2021-11-21 DIAGNOSIS — K7031 Alcoholic cirrhosis of liver with ascites: Secondary | ICD-10-CM

## 2021-11-21 NOTE — Patient Instructions (Addendum)
It was my pleasure to provide care to you today. Based on our discussion, I am providing you with my recommendations below:  RECOMMENDATION(S):   Congratulations for your ongoing sobriety.  I have recommended labs today   LABS:   Please proceed to the basement level for lab work before leaving today. Press "B" on the elevator. The lab is located at the first door on the left as you exit the elevator.  HEALTHCARE LAWS AND MY CHART RESULTS:   Due to recent changes in healthcare laws, you may see results of your imaging and/or laboratory studies on MyChart before I have had a chance to review them.  I understand that in some cases there may be results that are confusing or concerning to you. Please understand that not all results are received at the same time and often I may need to interpret multiple results in order to provide you with the best plan of care or course of treatment. Therefore, I ask that you please give me 48 hours to thoroughly review all your results before contacting my office for clarification.   Repeat imaging again in 6 months.   IMAGING:  You will be contacted by [facility] Scheduling (Your caller ID will indicate phone # [phone]) within the next business 7-10 business days to schedule your MRI - NOT NEEDED UNTIL MAY 2023. If you have not heard from them within 7-10 business days, please call [facility] Scheduling at [phone] to follow up on the status of your appointment.    ADDITIONAL RECOMMENDATIONS:  We discussed diet choices to help the liver. My recommendations include: - High-protein diet from a primarily plant-based source.   - Colorful fruits and vegetables are higher in antioxidants and increased antioxidants can help protect the liver.  - Avoid red meat.   - No raw or undercooked meat, seafood, or shellfish.   - Low-fat/cholesterol/carbohydrate diet is thought to be the best for the liver.   - Limit sodium to 2000 mg daily.    - I also recommend 30 minutes of aerobic and resistance exercise at least 3 days a week.   FOLLOW UP:  I would like for you to follow up with me in 6 months, or earlier if needed. Please call the office at [phone] to schedule your appointment.  BMI:  If you are age 15 or younger, your body mass index should be between 19-25. Your Body mass index is 26.69 kg/m. If this is out of the aformentioned range listed, please consider follow up with your Primary Care Provider.   MY CHART:  The [place] providers would like to encourage you to use [facility] to communicate with providers for non-urgent requests or questions.  Due to long hold times on the telephone, sending your provider a message by [facility] may be a faster and more efficient way to get a response.  Please allow 48 business hours for a response.  Please remember that this is for non-urgent requests.   Thank you for trusting me with your gastrointestinal care!    Tressia Danas, MD, MPH

## 2021-11-21 NOTE — Progress Notes (Signed)
Referring Provider: Mayer Masker, PA-C Primary Care Physician:  Mayer Masker, PA-C   Chief complaint:  liver disease   IMPRESSION:  Decompensated liver disease - likely underlying cirrhosis with hospitalization in May 2022 for concurrent alcohol-related hepatitis with ascites, hyponatremia, and coagulopathy. She has stopped drinking all alcohol without formal counseling. Symptoms of portal hypertension are stabilizing.   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   PLAN: - Continue abstinence from alcohol - Discussed diet recommendations in the setting of cirrhosis - Labs today including CMP, CBC, INR, AFP to calculate a MELD score - MRI 04/2022 for [facility] screening - Follow-up in 6 months, earlier if needed - Follow-up with PCP re questions about ear throbbing  HPI: Brandi James is a 40 y.o. female returns in follow-up.  I last saw her during her hospitalization in May 2022 for alcohol-related hepatitis and for an EGD 06/13/21. She returns today in scheduled follow-up. She is doing well. She has no specific complaints or concerns at this time except for ear throbbing. GI ROS is negative.     She has had the flu vaccine. She has not had the Covid vaccine.   Abdominal ultrasound 10/30/21 showed cirrhosis, trace perihepatic ascites, borderline splenomegaly  EGD 06/13/21: - Candideal esophagitis.  - Portal hypertensive gastropathy. No gastric or esophageal varices. - Gastric bypass with a small-sized pouch. - Normal examined jejunum.  Past Medical History:  Diagnosis Date   Depression with anxiety    Hypertension     01/20/2021   Liver disease    Panic attacks    Substance abuse (HCC)    01/20/2021    Past Surgical History:  Procedure Laterality Date   IR PARACENTESIS  05/08/2021   LAPAROSCOPIC CHOLECYSTECTOMY  2009   LAPAROSCOPIC GASTRIC BYPASS N/A 2003   [facility].  Dr Adele Dan   PANNICULECTOMY  2004   To address excessive skin  following weight loss    Prior to Admission medications   Medication Sig Start Date End Date Taking? Authorizing Provider  escitalopram (LEXAPRO) 5 MG tablet TAKE 1 TABLET BY MOUTH ONCE DAILY 09/08/21  Yes Abonza, Maritza, PA-C  furosemide (LASIX) 20 MG tablet Take 1 tablet (20 mg total) by mouth daily. 06/11/21  Yes Zehr, Princella Pellegrini, PA-C  gabapentin (NEURONTIN) 100 MG capsule Take 1 capsule (100 mg total) by mouth 3 (three) times daily. 08/26/21  Yes Abonza, Maritza, PA-C  hydrOXYzine (ATARAX/VISTARIL) 25 MG tablet Take 1 tablet (25 mg total) by mouth every 8 (eight) hours as needed for anxiety. 12/26/20  Yes Lamptey, Britta Mccreedy, MD  spironolactone (ALDACTONE) 25 MG tablet Take 2 tablets (50 mg total) by mouth daily. 10/20/21  Yes Tressia Danas, MD  propranolol (INDERAL) 10 MG tablet TAKE 1 TABLET BY MOUTH 2 TIMES DAILY. PATIENT NEEDS TELEMEDICINE VISIT PRIOR TO ANY FURTHER REFILLS Patient not taking: No sig reported 05/23/19 12/26/20  William Hamburger D, NP    Current Outpatient Medications  Medication Sig Dispense Refill   escitalopram (LEXAPRO) 5 MG tablet TAKE 1 TABLET BY MOUTH ONCE DAILY 90 tablet 0   furosemide (LASIX) 20 MG tablet Take 1 tablet (20 mg total) by mouth daily. 90 tablet 11   gabapentin (NEURONTIN) 100 MG capsule Take 1 capsule (100 mg total) by mouth 3 (three) times daily. 180 capsule 0   hydrOXYzine (ATARAX/VISTARIL) 25 MG tablet Take 1 tablet (25 mg total) by mouth every 8 (eight) hours as needed for anxiety. 60 tablet 0   spironolactone (ALDACTONE) 25  MG tablet Take 2 tablets (50 mg total) by mouth daily. 60 tablet 0   No current facility-administered medications for this visit.    Allergies as of 11/21/2021 - Review Complete 11/21/2021  Allergen Reaction Noted   Nsaids Other (See Comments) 03/24/2021   Morphine and related Other (See Comments) 09/08/2017    Family History  Problem Relation Age of Onset   Healthy Mother    Diabetes Father    Hypertension Father     Colon cancer Neg Hx    Rectal cancer Neg Hx    Stomach cancer Neg Hx    Esophageal cancer Neg Hx       Physical Exam: General:   Alert, in NAD. No scleral icterus. No bilateral temporal wasting.  Heart:  Regular rate and rhythm; no murmurs Pulm: Clear anteriorly; no wheezing Abdomen:  Soft. Nontender. Nondistended. Normal bowel sounds. No rebound or guarding. No fluid wave.  LAD: No inguinal or umbilical LAD Extremities:  Without edema. Neurologic:  Alert and  oriented x4;  grossly normal neurologically; no asterixis or clonus. Skin: No jaundice. Mild palmar erythema. No spider angioma.  Psych:  Alert and cooperative. Normal mood and affect.     Trust Crago L. Orvan Falconer, MD, MPH 11/24/2021, 3:53 PM

## 2021-11-24 ENCOUNTER — Encounter: Payer: Self-pay | Admitting: Gastroenterology

## 2021-11-26 ENCOUNTER — Encounter: Payer: Self-pay | Admitting: Nurse Practitioner

## 2021-11-26 ENCOUNTER — Other Ambulatory Visit: Payer: Self-pay

## 2021-11-26 ENCOUNTER — Ambulatory Visit (INDEPENDENT_AMBULATORY_CARE_PROVIDER_SITE_OTHER): Payer: 59 | Admitting: Nurse Practitioner

## 2021-11-26 VITALS — BP 90/60 | HR 83 | Temp 98.4°F | Ht 65.0 in | Wt 155.2 lb

## 2021-11-26 DIAGNOSIS — F411 Generalized anxiety disorder: Secondary | ICD-10-CM | POA: Diagnosis not present

## 2021-11-26 DIAGNOSIS — R252 Cramp and spasm: Secondary | ICD-10-CM | POA: Diagnosis not present

## 2021-11-26 DIAGNOSIS — E860 Dehydration: Secondary | ICD-10-CM | POA: Insufficient documentation

## 2021-11-26 NOTE — Progress Notes (Signed)
Acute Office Visit  Subjective:    Patient ID: Brandi James, female    DOB: January 25, 1981, 40 y.o.   MRN: [id number]  Chief Complaint  Patient presents with   Blood Pressure Check    HPI Patient is in today for acute visit. She did have long episode of dizziness earlier today. She is on diuretic. Had changed from drinking primarily water to drinking sodas during the day. She states that after feeling a bit dizzy, she did take a warm bath which made this worse. She states that she did take her diuretic pill yesterday. Blood pressure readings this morning and afternoon were as follows: 95/60 108/72 Her blood sugar reading was 162.  She states that se has lost eight pounds in four days, after visiting her GI doctor. She states that since thanksgiving, she has been taking the diuretic more routinely.  Reports muscle cramps in her legs this morning. She did take a teaspoon of mustard which improved the cramps. She has also had 50 ounces of fluids, mainly water and Gatorade since this morning.   Past Medical History:  Diagnosis Date   Depression with anxiety    Hypertension     01/20/2021   Liver disease    Panic attacks    Substance abuse ([place])    01/20/2021    Past Surgical History:  Procedure Laterality Date   IR PARACENTESIS  05/08/2021   LAPAROSCOPIC CHOLECYSTECTOMY  2009   LAPAROSCOPIC GASTRIC BYPASS N/A 2003   [place].  Dr Lise Auer   PANNICULECTOMY  2004   To address excessive skin following weight loss    Family History  Problem Relation Age of Onset   Healthy Mother    Diabetes Father    Hypertension Father    Colon cancer Neg Hx    Rectal cancer Neg Hx    Stomach cancer Neg Hx    Esophageal cancer Neg Hx     Social History   Socioeconomic History   Marital status: Single    Spouse name: Not on file   Number of children: Not on file   Years of education: Not on file   Highest education level: Not on file  Occupational History   Not on file  Tobacco Use    Smoking status: Former    Packs/day: 0.50    Types: Cigarettes    Quit date: 05/07/2021    Years since quitting: 0.5   Smokeless tobacco: Never  Vaping Use   Vaping Use: Never used  Substance and Sexual Activity   Alcohol use: Not Currently    Alcohol/week: 12.0 standard drinks    Types: 12 Glasses of wine per week    Comment: 2 bottles of wine daily *   Drug use: No   Sexual activity: Not Currently    Birth control/protection: None  Other Topics Concern   Not on file  Social History Narrative   Not on file   Social Determinants of Health   Financial Resource Strain: Not on file  Food Insecurity: Not on file  Transportation Needs: Not on file  Physical Activity: Not on file  Stress: Not on file  Social Connections: Not on file  Intimate Partner Violence: Not on file    Outpatient Medications Prior to Visit  Medication Sig Dispense Refill   escitalopram (LEXAPRO) 5 MG tablet TAKE 1 TABLET BY MOUTH ONCE DAILY 90 tablet 0   furosemide (LASIX) 20 MG tablet Take 1 tablet (20 mg total) by mouth daily. [place]  tablet 11   gabapentin (NEURONTIN) 100 MG capsule Take 1 capsule (100 mg total) by mouth 3 (three) times daily. 180 capsule 0   hydrOXYzine (ATARAX/VISTARIL) 25 MG tablet Take 1 tablet (25 mg total) by mouth every 8 (eight) hours as needed for anxiety. 60 tablet 0   spironolactone (ALDACTONE) 25 MG tablet Take 2 tablets (50 mg total) by mouth daily. 60 tablet 0   No facility-administered medications prior to visit.    Allergies  Allergen Reactions   Nsaids Other (See Comments)    H/o Roux-en-Y gastric bypass   Morphine And Related Other (See Comments)    Extreme mood swings     Review of Systems  Constitutional:  Positive for fatigue. Negative for activity change, appetite change, chills and fever.  HENT:  Negative for congestion, postnasal drip, rhinorrhea, sinus pressure, sinus pain, sneezing and sore throat.   Eyes: Negative.   Respiratory:  Negative for cough,  chest tightness, shortness of breath and wheezing.   Cardiovascular:  Negative for chest pain and palpitations.       Lower blood pressure readings.   Gastrointestinal:  Negative for abdominal pain, constipation, diarrhea, nausea and vomiting.  Endocrine: Negative for cold intolerance, heat intolerance, polydipsia and polyuria.       Blood sugar was 163 earlier today   Genitourinary:  Negative for dyspareunia, dysuria, flank pain, frequency and urgency.  Musculoskeletal:  Negative for arthralgias, back pain and myalgias.  Skin:  Negative for rash.  Allergic/Immunologic: Negative for environmental allergies.  Neurological:  Positive for dizziness and weakness. Negative for headaches.  Hematological:  Negative for adenopathy.  Psychiatric/Behavioral:  The patient is not nervous/anxious.       Objective:    Physical Exam Vitals and nursing note reviewed.  Constitutional:      Appearance: Normal appearance. She is well-developed.  HENT:     Head: Normocephalic and atraumatic.  Eyes:     Pupils: Pupils are equal, round, and reactive to light.  Cardiovascular:     Rate and Rhythm: Normal rate and regular rhythm.     Pulses: Normal pulses.     Heart sounds: Murmur heard.     Comments: Soft, blowing murmur auscultated today Pulmonary:     Effort: Pulmonary effort is normal.     Breath sounds: Normal breath sounds.  Abdominal:     Palpations: Abdomen is soft.  Musculoskeletal:        General: Normal range of motion.     Cervical back: Normal range of motion and neck supple.  Lymphadenopathy:     Cervical: No cervical adenopathy.  Skin:    General: Skin is warm and dry.     Capillary Refill: Capillary refill takes less than 2 seconds.  Neurological:     General: No focal deficit present.     Mental Status: She is alert and oriented to person, place, and time.  Psychiatric:        Mood and Affect: Mood normal.        Behavior: Behavior normal.        Thought Content: Thought  content normal.        Judgment: Judgment normal.   Today's Vitals   11/26/21 1347  BP: 90/60  Pulse: 83  Temp: 98.4 F (36.9 C)  SpO2: 97%  Weight: 155 lb 3.2 oz (70.4 kg)  Height: _0  (1.651 m)   Body mass index is 25.83 kg/m.   Wt Readings from Last 3 Encounters:  11/26/21 155 lb  3.2 oz (70.4 kg)  11/21/21 160 lb 6 oz (72.7 kg)  08/26/21 161 lb 9.6 oz (73.3 kg)    Health Maintenance Due  Topic Date Due   COVID-19 Vaccine (1) Never done   PAP SMEAR-Modifier  Never done   TETANUS/TDAP  05/19/2021    There are no preventive care reminders to display for this patient.   Lab Results  Component Value Date   TSH 3.260 12/26/2020   Lab Results  Component Value Date   WBC 8.1 05/16/2021   HGB 9.5 (L) 05/16/2021   HCT 26.3 (L) 05/16/2021   MCV 96 05/16/2021   PLT 201 05/16/2021   Lab Results  Component Value Date   NA 136 09/03/2021   K 4.2 09/03/2021   CO2 23 09/03/2021   GLUCOSE 90 09/03/2021   BUN 11 09/03/2021   CREATININE 0.66 09/03/2021   BILITOT 1.1 09/03/2021   ALKPHOS 84 09/03/2021   AST 37 09/03/2021   ALT 20 09/03/2021   PROT 7.3 09/03/2021   ALBUMIN 4.1 09/03/2021   CALCIUM 9.1 09/03/2021   ANIONGAP 7 05/08/2021   EGFR 112 09/03/2021   GFR 115.74 06/11/2021   Lab Results  Component Value Date   CHOL 191 12/26/2020   Lab Results  Component Value Date   HDL 37 (L) 12/26/2020   Lab Results  Component Value Date   LDLCALC 132 (H) 12/26/2020   Lab Results  Component Value Date   TRIG 119 12/26/2020   Lab Results  Component Value Date   CHOLHDL 5.2 (H) 12/26/2020   Lab Results  Component Value Date   HGBA1C 5.5 12/26/2020       Assessment & Plan:  1. Dehydration Suspect dizziness earlier today related to dehydration, made worse by hot bath. Recommend she hold off taking diuretic today. Increase fluids, mainly water and Gatorade. Will check BMP, CBC, and HgbA1c for further evaluation.  - Basic Metabolic Panel (BMET);  Future - Hemoglobin A1c; Future - CBC with Differential/Platelet; Future - CBC with Differential/Platelet - Hemoglobin A2Z - Basic Metabolic Panel (BMET)  2. Muscle cramps Check BMP, CBC, and HgbA1c for further evaluation. - Basic Metabolic Panel (BMET); Future - Hemoglobin A1c; Future - CBC with Differential/Platelet; Future - CBC with Differential/Platelet - Hemoglobin Q0Q - Basic Metabolic Panel (BMET)  3. Generalized anxiety disorder Continue medication as prescribed.    Problem List Items Addressed This Visit       Other   Generalized anxiety disorder   Dehydration - Primary   Relevant Orders   Basic Metabolic Panel (BMET)   Hemoglobin A1c   CBC with Differential/Platelet   Muscle cramps   Relevant Orders   Basic Metabolic Panel (BMET)   Hemoglobin A1c   CBC with Differential/Platelet      Ronnell Freshwater, NP

## 2021-11-27 ENCOUNTER — Encounter: Payer: Self-pay | Admitting: Nurse Practitioner

## 2021-11-27 ENCOUNTER — Other Ambulatory Visit: Payer: Self-pay | Admitting: Nurse Practitioner

## 2021-11-27 DIAGNOSIS — D509 Iron deficiency anemia, unspecified: Secondary | ICD-10-CM

## 2021-11-27 LAB — BASIC METABOLIC PANEL
BUN/Creatinine Ratio: 19 (ref 9–23)
BUN: 14 mg/dL (ref 6–24)
CO2: 24 mmol/L (ref 20–29)
Calcium: 9.1 mg/dL (ref 8.7–10.2)
Chloride: 101 mmol/L (ref 96–106)
Creatinine, Ser: 0.72 mg/dL (ref 0.57–1.00)
Glucose: 93 mg/dL (ref 70–99)
Potassium: 4.2 mmol/L (ref 3.5–5.2)
Sodium: 138 mmol/L (ref 134–144)
eGFR: 107 mL/min/{1.73_m2} (ref >59)

## 2021-11-27 LAB — CBC WITH DIFFERENTIAL/PLATELET
Basophils Absolute: 0 10*3/uL (ref 0.0–0.2)
Basos: 1 %
EOS (ABSOLUTE): 0 10*3/uL (ref 0.0–0.4)
Eos: 1 %
Hematocrit: 23.4 % — ABNORMAL LOW (ref 34.0–46.6)
Hemoglobin: 6.9 g/dL — CL (ref 11.1–15.9)
Immature Grans (Abs): 0 10*3/uL (ref 0.0–0.1)
Immature Granulocytes: 0 %
Lymphocytes Absolute: 1.2 10*3/uL (ref 0.7–3.1)
Lymphs: 34 %
MCH: 19.7 pg — ABNORMAL LOW (ref 26.6–33.0)
MCHC: 29.5 g/dL — ABNORMAL LOW (ref 31.5–35.7)
MCV: 67 fL — ABNORMAL LOW (ref 79–97)
Monocytes Absolute: 0.5 10*3/uL (ref 0.1–0.9)
Monocytes: 13 %
Neutrophils Absolute: 1.9 10*3/uL (ref 1.4–7.0)
Neutrophils: 51 %
Platelets: 104 10*3/uL — ABNORMAL LOW (ref 150–450)
RBC: 3.5 x10E6/uL — ABNORMAL LOW (ref 3.77–5.28)
RDW: 21.6 % — ABNORMAL HIGH (ref 11.7–15.4)
WBC: 3.7 10*3/uL (ref 3.4–10.8)

## 2021-11-27 LAB — HEMOGLOBIN A1C
Est. average glucose Bld gHb Est-mCnc: 97 mg/dL
Hgb A1c MFr Bld: 5 % (ref 4.8–5.6)

## 2021-11-27 NOTE — Progress Notes (Signed)
Hey. Please let the patient know that I have her labs back. She is VERY anemic with a Hgb of 6.9. I now she is seeing a GI provider, but has she seen a hematologist? If not, I am going to place a stat referral today. Thanks.

## 2021-11-27 NOTE — Progress Notes (Signed)
I have placed a STAT referral to hematology for her. She is aware that referral was being made today.

## 2021-11-27 NOTE — Progress Notes (Signed)
Stat referral to hematology placed due to hgb of 6.9 on labs done 11/26/2021. Dizziness and murmur present on exam .

## 2021-12-03 NOTE — Addendum Note (Signed)
Addended by: Peterson Ao, Io Dieujuste J on: 12/03/2021 11:00 AM   Modules accepted: Orders

## 2021-12-12 ENCOUNTER — Other Ambulatory Visit: Payer: 59

## 2021-12-12 ENCOUNTER — Encounter: Payer: 59 | Admitting: Oncology

## 2021-12-16 ENCOUNTER — Telehealth: Payer: Self-pay | Admitting: Hematology and Oncology

## 2021-12-16 NOTE — Telephone Encounter (Signed)
Rescheduled upcoming appointment per patient's request.

## 2021-12-17 ENCOUNTER — Encounter: Payer: 59 | Admitting: Hematology and Oncology

## 2021-12-17 ENCOUNTER — Other Ambulatory Visit: Payer: 59

## 2021-12-30 ENCOUNTER — Other Ambulatory Visit: Payer: Self-pay | Admitting: Gastroenterology

## 2021-12-30 ENCOUNTER — Other Ambulatory Visit: Payer: Self-pay

## 2021-12-30 ENCOUNTER — Telehealth: Payer: Self-pay | Admitting: Physician Assistant

## 2021-12-30 DIAGNOSIS — F411 Generalized anxiety disorder: Secondary | ICD-10-CM

## 2021-12-30 MED ORDER — ESCITALOPRAM OXALATE 5 MG PO TABS: 5.0000 mg | ORAL_TABLET | Freq: Every day | ORAL | 1 refills | Status: DC

## 2021-12-30 NOTE — Telephone Encounter (Signed)
Patient called and requested refill of Escitalopram and would like for it to go to [facility]. [phone]

## 2021-12-30 NOTE — Telephone Encounter (Signed)
Patient is notified and appointment is scheduled.

## 2022-01-01 ENCOUNTER — Other Ambulatory Visit: Payer: 59

## 2022-01-01 ENCOUNTER — Encounter: Payer: 59 | Admitting: Hematology and Oncology

## 2022-01-05 ENCOUNTER — Telehealth: Payer: Self-pay

## 2022-01-05 NOTE — Telephone Encounter (Signed)
Attempted to reach pt regarding scheduling appt for vaccine but received message that voice mailbox is full and cannot accept new messages. Will try again.  Pt scheduled for last twinrix injection 01/12/22 at 3:30pm. Pt aware of appt.

## 2022-01-05 NOTE — Telephone Encounter (Signed)
-----   Message from Chrystie Nose, RN sent at 07/11/2021  9:02 AM EDT ----- Regarding: Twinrix #3 Due for last injection 1/22

## 2022-01-07 ENCOUNTER — Encounter: Payer: Self-pay | Admitting: Gastroenterology

## 2022-01-07 NOTE — Telephone Encounter (Signed)
Attempted to call with reminder to complete labs but no answer. Letter has been mailed reminding pt to complete her labs.

## 2022-01-07 NOTE — Telephone Encounter (Signed)
Pt returned call. States she intends to complete her labs on the same day as her vaccine (01/12/22).

## 2022-01-08 ENCOUNTER — Telehealth: Payer: Self-pay

## 2022-01-08 NOTE — Telephone Encounter (Signed)
-----   Message from Chrystie Nose, RN sent at 08/01/2021 11:22 AM EDT ----- Regarding: Twinrix #3 Pt due for last twinrix vaccine, order in epic.

## 2022-01-08 NOTE — Telephone Encounter (Signed)
Called and reminded patient of her last TwinRix injection 01/12/22 @ 3:30 pm

## 2022-01-12 ENCOUNTER — Other Ambulatory Visit (INDEPENDENT_AMBULATORY_CARE_PROVIDER_SITE_OTHER): Payer: 59

## 2022-01-12 ENCOUNTER — Ambulatory Visit (INDEPENDENT_AMBULATORY_CARE_PROVIDER_SITE_OTHER): Payer: 59 | Admitting: Gastroenterology

## 2022-01-12 DIAGNOSIS — Z23 Encounter for immunization: Secondary | ICD-10-CM | POA: Diagnosis not present

## 2022-01-12 DIAGNOSIS — K7031 Alcoholic cirrhosis of liver with ascites: Secondary | ICD-10-CM

## 2022-01-12 LAB — CBC
HCT: 27.9 % — ABNORMAL LOW (ref 36.0–46.0)
Hemoglobin: 8.6 g/dL — ABNORMAL LOW (ref 12.0–15.0)
MCHC: 31 g/dL (ref 30.0–36.0)
MCV: 71 fl — ABNORMAL LOW (ref 78.0–100.0)
Platelets: 81 10*3/uL — ABNORMAL LOW (ref 150.0–400.0)
RBC: 3.93 Mil/uL (ref 3.87–5.11)
RDW: 25.7 % — ABNORMAL HIGH (ref 11.5–15.5)
WBC: 4.8 10*3/uL (ref 4.0–10.5)

## 2022-01-12 LAB — COMPREHENSIVE METABOLIC PANEL
ALT: 24 U/L (ref 0–35)
AST: 34 U/L (ref 0–37)
Albumin: 4.2 g/dL (ref 3.5–5.2)
Alkaline Phosphatase: 93 U/L (ref 39–117)
BUN: 13 mg/dL (ref 6–23)
CO2: 25 mEq/L (ref 19–32)
Calcium: 9 mg/dL (ref 8.4–10.5)
Chloride: 101 mEq/L (ref 96–112)
Creatinine, Ser: 0.59 mg/dL (ref 0.40–1.20)
GFR: 110.75 mL/min (ref >60.00)
Glucose, Bld: 85 mg/dL (ref 70–99)
Potassium: 4 mEq/L (ref 3.5–5.1)
Sodium: 135 mEq/L (ref 135–145)
Total Bilirubin: 1 mg/dL (ref 0.2–1.2)
Total Protein: 7.6 g/dL (ref 6.0–8.3)

## 2022-01-12 LAB — PROTIME-INR
INR: 1.5 ratio — ABNORMAL HIGH (ref 0.8–1.0)
Prothrombin Time: 16.1 s — ABNORMAL HIGH (ref 9.6–13.1)

## 2022-01-13 ENCOUNTER — Telehealth: Payer: Self-pay | Admitting: Hematology and Oncology

## 2022-01-13 ENCOUNTER — Other Ambulatory Visit: Payer: Self-pay

## 2022-01-13 DIAGNOSIS — D649 Anemia, unspecified: Secondary | ICD-10-CM

## 2022-01-13 DIAGNOSIS — D696 Thrombocytopenia, unspecified: Secondary | ICD-10-CM

## 2022-01-13 LAB — AFP TUMOR MARKER: AFP-Tumor Marker: 3.3 ng/mL

## 2022-01-13 NOTE — Telephone Encounter (Signed)
Pt had called in to r/s her cancelled appt with Dr. Leonides Schanz. I r/s her appt, she is aware of new appt date and time.

## 2022-01-21 NOTE — Progress Notes (Signed)
Established Patient Office Visit  Subjective:  Patient ID: Brandi James, female    DOB: 05/05/1981  Age: 41 y.o. MRN: [id number]  CC:  Chief Complaint  Patient presents with   Follow-up    HPI Brandi James presents for follow up on neuropathy and mood. Patient was started on Gabapentin for neuropathy and states medication has helped to reduce the sharp pains of lower extremity. States has been more active with walking, Zumba and re-decorating her house. Patient reports there are certain situations that have triggered a few panic attacks and has needed to take hydroxyzine 25 mg which helps to calm her down and requesting refill. Reports medication compliance with Lexapro. No SI/HI.  HTN: Pt denies chest pain, palpitations, dizziness or lower extremity swelling. Taking medication as directed without side effects. Pt follows a low salt diet.   Depression screen [facility] 2/9 01/27/2022 11/26/2021 08/26/2021 05/21/2021 01/21/2021  Decreased Interest 0 0 $R'1 1 1  'gL$ Down, Depressed, Hopeless 0 0 $R'1 1 1  'sZ$ PHQ - 2 Score 0 0 $R'2 2 2  'eR$ Altered sleeping $RemoveBeforeDE'1 2 1 3 3  'QkBMvNErOpxFuSm$ Tired, decreased energy $RemoveBeforeDE'1 1 3 3 1  'EMQReATDpsVhurj$ Change in appetite 0 1 0 2 1  Feeling bad or failure about yourself  0 0 $R'1 1 1  'Rs$ Trouble concentrating 0 1 0 0 0  Moving slowly or fidgety/restless 0 1 1 0 0  Suicidal thoughts 0 0 0 0 0  PHQ-9 Score $RemoveBef'2 6 8 11 8  'NHoSBKdBbA$ Difficult doing work/chores Somewhat difficult - - - -   GAD 7 : Generalized Anxiety Score 11/26/2021 08/26/2021 05/21/2021  Nervous, Anxious, on Edge $Remov'1 1 1  'xZVXvA$ Control/stop worrying 0 0 -  Worry too much - different things 0 0 2  Trouble relaxing $RemoveBeforeDE'1 1 2  'WdBKeyOBnolmQkg$ Restless 0 0 1  Easily annoyed or irritable 0 1 1  Afraid - awful might happen 0 0 1  Total GAD 7 Score 2 3 -      Past Medical History:  Diagnosis Date   Depression with anxiety    Hypertension     01/20/2021   Liver disease    Panic attacks    Substance abuse ([place])    01/20/2021    Past Surgical History:  Procedure Laterality Date   IR  PARACENTESIS  05/08/2021   LAPAROSCOPIC CHOLECYSTECTOMY  2009   LAPAROSCOPIC GASTRIC BYPASS N/A 2003   [facility].  Dr Lise Auer   PANNICULECTOMY  2004   To address excessive skin following weight loss    Family History  Problem Relation Age of Onset   Healthy Mother    Diabetes Father    Hypertension Father    Colon cancer Neg Hx    Rectal cancer Neg Hx    Stomach cancer Neg Hx    Esophageal cancer Neg Hx     Social History   Socioeconomic History   Marital status: Single    Spouse name: Not on file   Number of children: Not on file   Years of education: Not on file   Highest education level: Not on file  Occupational History   Not on file  Tobacco Use   Smoking status: Former    Packs/day: 0.50    Types: Cigarettes    Quit date: 05/07/2021    Years since quitting: 0.7   Smokeless tobacco: Never  Vaping Use   Vaping Use: Never used  Substance and Sexual Activity   Alcohol use: Not Currently  Alcohol/week: 12.0 standard drinks    Types: 12 Glasses of wine per week    Comment: 2 bottles of wine daily *   Drug use: No   Sexual activity: Not Currently    Birth control/protection: None  Other Topics Concern   Not on file  Social History Narrative   Not on file   Social Determinants of Health   Financial Resource Strain: Not on file  Food Insecurity: Not on file  Transportation Needs: Not on file  Physical Activity: Not on file  Stress: Not on file  Social Connections: Not on file  Intimate Partner Violence: Not on file    Outpatient Medications Prior to Visit  Medication Sig Dispense Refill   escitalopram (LEXAPRO) 5 MG tablet Take 1 tablet (5 mg total) by mouth daily. 90 tablet 1   spironolactone (ALDACTONE) 50 MG tablet Take 1 tablet (50 mg total) by mouth daily. 15 tablet 0   gabapentin (NEURONTIN) 100 MG capsule Take 1 capsule (100 mg total) by mouth 3 (three) times daily. 180 capsule 0   hydrOXYzine (ATARAX/VISTARIL) 25 MG tablet Take 1 tablet (25 mg  total) by mouth every 8 (eight) hours as needed for anxiety. 60 tablet 0   furosemide (LASIX) 20 MG tablet TAKE 1 TABLET BY MOUTH DAILY (Patient not taking: Reported on 01/27/2022) 90 tablet 0   No facility-administered medications prior to visit.    Allergies  Allergen Reactions   Nsaids Other (See Comments)    H/o Roux-en-Y gastric bypass   Morphine And Related Other (See Comments)    Extreme mood swings     ROS Review of Systems Review of Systems:  A fourteen system review of systems was performed and found to be positive as per HPI.   Objective:    Physical Exam General:  Well Developed, well nourished, appropriate for stated age.  Neuro:  Alert and oriented,  extra-ocular muscles intact  HEENT:  Normocephalic, atraumatic, neck supple, no carotid bruits appreciated  Skin:  no gross rash, warm, pink. Cardiac:  RRR, S1 S2 Respiratory:  CTA B/L w/o wheezing, crackles or rales. Vascular:  Ext warm, no cyanosis apprec.; cap RF less 2 sec. Psych:  No HI/SI, judgement and insight good, Euthymic mood. Full Affect.  There were no vitals taken for this visit. Wt Readings from Last 3 Encounters:  11/26/21 155 lb 3.2 oz (70.4 kg)  11/21/21 160 lb 6 oz (72.7 kg)  08/26/21 161 lb 9.6 oz (73.3 kg)     Health Maintenance Due  Topic Date Due   COVID-19 Vaccine (1) Never done   PAP SMEAR-Modifier  Never done   TETANUS/TDAP  05/19/2021    There are no preventive care reminders to display for this patient.  Lab Results  Component Value Date   TSH 3.260 12/26/2020   Lab Results  Component Value Date   WBC 4.8 01/12/2022   HGB 8.6 Repeated and verified X2. (L) 01/12/2022   HCT 27.9 (L) 01/12/2022   MCV 71.0 (L) 01/12/2022   PLT 81.0 Repeated and verified X2. (L) 01/12/2022   Lab Results  Component Value Date   NA 135 01/12/2022   K 4.0 01/12/2022   CO2 25 01/12/2022   GLUCOSE 85 01/12/2022   BUN 13 01/12/2022   CREATININE 0.59 01/12/2022   BILITOT 1.0 01/12/2022    ALKPHOS 93 01/12/2022   AST 34 01/12/2022   ALT 24 01/12/2022   PROT 7.6 01/12/2022   ALBUMIN 4.2 01/12/2022   CALCIUM 9.0 01/12/2022  ANIONGAP 7 05/08/2021   EGFR 107 11/26/2021   GFR 110.75 01/12/2022   Lab Results  Component Value Date   CHOL 191 12/26/2020   Lab Results  Component Value Date   HDL 37 (L) 12/26/2020   Lab Results  Component Value Date   LDLCALC 132 (H) 12/26/2020   Lab Results  Component Value Date   TRIG 119 12/26/2020   Lab Results  Component Value Date   CHOLHDL 5.2 (H) 12/26/2020   Lab Results  Component Value Date   HGBA1C 5.0 11/26/2021      Assessment & Plan:   Problem List Items Addressed This Visit       Cardiovascular and Mediastinum   HTN, goal below 130/80    -Stable. 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  -Will continue to monitor.        Nervous and Auditory   Neuropathy - Primary    -Improved and stable. -Continue current medication regimen. Provided refill.  -Will continue to monitor.      Relevant Medications   gabapentin (NEURONTIN) 100 MG capsule     Other   Generalized anxiety disorder    -PHQ-2 score of 0. Last GAD-7 score stable. -Continue Lexapro 5 mg daily and Hydroxyzine 25 mg as needed for severe anxiety/panic attack. -Will continue to monitor.      Relevant Medications   hydrOXYzine (ATARAX) 25 MG tablet    Meds ordered this encounter  Medications   gabapentin (NEURONTIN) 100 MG capsule    Sig: Take 1 capsule (100 mg total) by mouth 3 (three) times daily.    Dispense:  270 capsule    Refill:  1    Order Specific Question:   Supervising Provider    Answer:   Beatrice Lecher D [[id number]]   hydrOXYzine (ATARAX) 25 MG tablet    Sig: Take 1 tablet (25 mg total) by mouth every 8 (eight) hours as needed for anxiety.    Dispense:  60 tablet    Refill:  0    Order Specific Question:   Supervising Provider    Answer:   Beatrice Lecher D [[id number]]    Follow-up: Return  for CPEw/ PAP and FBW in 1-2 months same day is ok.    Lorrene Reid, PA-C

## 2022-01-27 ENCOUNTER — Ambulatory Visit (INDEPENDENT_AMBULATORY_CARE_PROVIDER_SITE_OTHER): Payer: 59 | Admitting: Physician Assistant

## 2022-01-27 ENCOUNTER — Other Ambulatory Visit: Payer: Self-pay

## 2022-01-27 ENCOUNTER — Other Ambulatory Visit: Payer: Self-pay | Admitting: Gastroenterology

## 2022-01-27 ENCOUNTER — Encounter: Payer: Self-pay | Admitting: Physician Assistant

## 2022-01-27 VITALS — BP 113/74 | HR 73 | Temp 98.3°F | Ht 65.0 in | Wt 164.0 lb

## 2022-01-27 DIAGNOSIS — F411 Generalized anxiety disorder: Secondary | ICD-10-CM

## 2022-01-27 DIAGNOSIS — I10 Essential (primary) hypertension: Secondary | ICD-10-CM

## 2022-01-27 DIAGNOSIS — G629 Polyneuropathy, unspecified: Secondary | ICD-10-CM | POA: Insufficient documentation

## 2022-01-27 MED ORDER — HYDROXYZINE HCL 25 MG PO TABS: 25.0000 mg | ORAL_TABLET | Freq: Three times a day (TID) | ORAL | 0 refills | Status: DC | PRN

## 2022-01-27 MED ORDER — GABAPENTIN 100 MG PO CAPS: 100.0000 mg | ORAL_CAPSULE | Freq: Three times a day (TID) | ORAL | 1 refills | Status: DC

## 2022-01-27 NOTE — Patient Instructions (Signed)

## 2022-01-27 NOTE — Assessment & Plan Note (Signed)
-  Improved and stable. -Continue current medication regimen. Provided refill.  -Will continue to monitor.

## 2022-01-27 NOTE — Assessment & Plan Note (Signed)
-  Stable. 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  -Will continue to monitor.

## 2022-01-27 NOTE — Assessment & Plan Note (Addendum)
-  PHQ-2 score of 0. Last GAD-7 score stable. -Continue Lexapro 5 mg daily and Hydroxyzine 25 mg as needed for severe anxiety/panic attack. -Will continue to monitor.

## 2022-01-28 ENCOUNTER — Inpatient Hospital Stay: Payer: 59

## 2022-01-28 ENCOUNTER — Inpatient Hospital Stay: Payer: 59 | Attending: Hematology and Oncology | Admitting: Hematology and Oncology

## 2022-01-28 VITALS — BP 109/63 | HR 56 | Temp 98.1°F | Resp 16 | Wt 162.2 lb

## 2022-01-28 DIAGNOSIS — K7460 Unspecified cirrhosis of liver: Secondary | ICD-10-CM | POA: Insufficient documentation

## 2022-01-28 DIAGNOSIS — D696 Thrombocytopenia, unspecified: Secondary | ICD-10-CM | POA: Insufficient documentation

## 2022-01-28 DIAGNOSIS — D509 Iron deficiency anemia, unspecified: Secondary | ICD-10-CM | POA: Insufficient documentation

## 2022-01-28 DIAGNOSIS — Z79899 Other long term (current) drug therapy: Secondary | ICD-10-CM | POA: Diagnosis not present

## 2022-01-28 DIAGNOSIS — D500 Iron deficiency anemia secondary to blood loss (chronic): Secondary | ICD-10-CM

## 2022-01-28 DIAGNOSIS — Z9884 Bariatric surgery status: Secondary | ICD-10-CM | POA: Insufficient documentation

## 2022-01-28 DIAGNOSIS — Z87891 Personal history of nicotine dependence: Secondary | ICD-10-CM | POA: Diagnosis not present

## 2022-01-28 LAB — CMP (CANCER CENTER ONLY)
ALT: 28 U/L (ref 0–44)
AST: 35 U/L (ref 15–41)
Albumin: 4.1 g/dL (ref 3.5–5.0)
Alkaline Phosphatase: 77 U/L (ref 38–126)
Anion gap: 5 (ref 5–15)
BUN: 14 mg/dL (ref 6–20)
CO2: 27 mmol/L (ref 22–32)
Calcium: 9.4 mg/dL (ref 8.9–10.3)
Chloride: 107 mmol/L (ref 98–111)
Creatinine: 0.57 mg/dL (ref 0.44–1.00)
GFR, Estimated: >60 mL/min (ref >60)
Glucose, Bld: 82 mg/dL (ref 70–99)
Potassium: 4 mmol/L (ref 3.5–5.1)
Sodium: 139 mmol/L (ref 135–145)
Total Bilirubin: 0.9 mg/dL (ref 0.3–1.2)
Total Protein: 7.5 g/dL (ref 6.5–8.1)

## 2022-01-28 LAB — CBC WITH DIFFERENTIAL (CANCER CENTER ONLY)
Abs Immature Granulocytes: 0.01 10*3/uL (ref 0.00–0.07)
Basophils Absolute: 0 10*3/uL (ref 0.0–0.1)
Basophils Relative: 1 %
Eosinophils Absolute: 0.1 10*3/uL (ref 0.0–0.5)
Eosinophils Relative: 2 %
HCT: 30.1 % — ABNORMAL LOW (ref 36.0–46.0)
Hemoglobin: 9 g/dL — ABNORMAL LOW (ref 12.0–15.0)
Immature Granulocytes: 0 %
Lymphocytes Relative: 31 %
Lymphs Abs: 1.3 10*3/uL (ref 0.7–4.0)
MCH: 22 pg — ABNORMAL LOW (ref 26.0–34.0)
MCHC: 29.9 g/dL — ABNORMAL LOW (ref 30.0–36.0)
MCV: 73.4 fL — ABNORMAL LOW (ref 80.0–100.0)
Monocytes Absolute: 0.5 10*3/uL (ref 0.1–1.0)
Monocytes Relative: 13 %
Neutro Abs: 2.2 10*3/uL (ref 1.7–7.7)
Neutrophils Relative %: 53 %
Platelet Count: 128 10*3/uL — ABNORMAL LOW (ref 150–400)
RBC: 4.1 MIL/uL (ref 3.87–5.11)
RDW: 23.4 % — ABNORMAL HIGH (ref 11.5–15.5)
Smear Review: NORMAL
WBC Count: 4.1 10*3/uL (ref 4.0–10.5)
nRBC: 0 % (ref 0.0–0.2)

## 2022-01-28 LAB — IRON AND IRON BINDING CAPACITY (CC-WL,HP ONLY)
Iron: 27 ug/dL — ABNORMAL LOW (ref 28–170)
Saturation Ratios: 4 % — ABNORMAL LOW (ref 10.4–31.8)
TIBC: 638 ug/dL — ABNORMAL HIGH (ref 250–450)
UIBC: 611 ug/dL — ABNORMAL HIGH (ref 148–442)

## 2022-01-28 LAB — RETIC PANEL
Immature Retic Fract: 16.9 % — ABNORMAL HIGH (ref 2.3–15.9)
RBC.: 4.04 MIL/uL (ref 3.87–5.11)
Retic Count, Absolute: 59.8 10*3/uL (ref 19.0–186.0)
Retic Ct Pct: 1.5 % (ref 0.4–3.1)
Reticulocyte Hemoglobin: 26 pg — ABNORMAL LOW (ref >27.9)

## 2022-01-28 LAB — FERRITIN: Ferritin: 9 ng/mL — ABNORMAL LOW (ref 11–307)

## 2022-01-28 LAB — VITAMIN B12: Vitamin B-12: 576 pg/mL (ref 180–914)

## 2022-01-28 NOTE — Progress Notes (Signed)
[place] [phone]   Fax:([phone] [place] NOTE  Patient Care Team: Lorrene Reid, PA-C as PCP - General (Physician Assistant) Michael Boston, MD as Consulting Physician (General Surgery)  Hematological/Oncological History # Microcytic Anemia # Thrombocytopenia  11/26/2021: WBC 3.7, Hgb 6.9, Plt 104, MCV 67 01/12/2022: WBC 4.8, Hgb 8.6, Plt 81, MCV 71  01/28/2022: establish care with Dr. Lorenso Courier   CHIEF COMPLAINTS/PURPOSE OF CONSULTATION:  "Anemia and Thrombocytopenia "  HISTORY OF PRESENTING ILLNESS:  Brandi James 41 y.o. 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   On review of the previous records Brandi James has a history of anemia dating back to at least 05/07/2021.  Hemoglobin at the time was 10.5 with an MCV of 92.  Her hemoglobin is steadily declined since that time noted to have been 6.9 on 11/26/2021 with an MCV of 67.  Most recently on 01/12/2022 the patient was found to have a hemoglobin 8.6, MCV 71, platelets of 81.  Due to concern for these findings the patient was referred to hematology for further evaluation and management.  On exam today Brandi James reports that in early December she felt "dizzy" and dehydrated.  She felt like she "would pass out".  She was surprised to learn how anemic she was at that time.  She notes that she began taking p.o. iron supplementation with extended release and on recheck her hemoglobin had improved to 8.6 on 01/12/2022.  She notes that for the last 2 to 3 years she has not often had menstrual cycles and that they have been quite brief.  She notes that recently since September/October her menstrual cycles are occurring on a more regular basis and lasting for 7 days with 2-3 heavy days.  She reports that she goes about 3 tampons per day on her heaviest day.   She notes she is not having any other overt signs of bleeding.  She has been taking slow release iron and this is because the standard iron was causing her symptoms of constipation.  She does endorse having undergone gastric bypass surgery in 2003.  The procedure was a Roux-en-Y.  She is a former smoker having quit at the same time she quit alcohol.  She quit alcohol due to her cirrhosis in May 2022.  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  She notes that she has no family history of blood diseases blood disorders or cancer.  Her father has diabetes and neuropathy and her mother is otherwise healthy.  She notes that since has been taking iron supplementation she is not having any issues with lightheadedness, dizziness, or shortness of breath.  She otherwise denies any fevers, chills, sweats, nausea, vomit or diarrhea.  A full 10 point ROS is listed below.  MEDICAL HISTORY:  Past Medical History:  Diagnosis Date   Depression with anxiety    Hypertension     01/20/2021   Liver disease    Panic attacks    Substance abuse ([place])    01/20/2021    SURGICAL HISTORY: Past Surgical History:  Procedure Laterality Date   IR PARACENTESIS  05/08/2021   LAPAROSCOPIC CHOLECYSTECTOMY  2009   LAPAROSCOPIC GASTRIC BYPASS N/A 2003   [place].  Dr Lise Auer   PANNICULECTOMY  2004   To address excessive skin following weight loss    SOCIAL  HISTORY: Social History   Socioeconomic History   Marital status: Single    Spouse name: Not on file   Number of children: Not on file   Years of education: Not on file   Highest education level: Not on file  Occupational History   Not on file  Tobacco Use   Smoking status: Former    Packs/day: 0.50    Types: Cigarettes    Quit date: 05/07/2021    Years since quitting: 0.7   Smokeless tobacco: Never  Vaping Use   Vaping Use: Never used  Substance and Sexual Activity   Alcohol use: Not Currently     Alcohol/week: 12.0 standard drinks    Types: 12 Glasses of wine per week    Comment: 2 bottles of wine daily *   Drug use: No   Sexual activity: Not Currently    Birth control/protection: None  Other Topics Concern   Not on file  Social History Narrative   Not on file   Social Determinants of Health   Financial Resource Strain: Not on file  Food Insecurity: Not on file  Transportation Needs: Not on file  Physical Activity: Not on file  Stress: Not on file  Social Connections: Not on file  Intimate Partner Violence: Not on file    FAMILY HISTORY: Family History  Problem Relation Age of Onset   Healthy Mother    Diabetes Father    Hypertension Father    Colon cancer Neg Hx    Rectal cancer Neg Hx    Stomach cancer Neg Hx    Esophageal cancer Neg Hx     ALLERGIES:  is allergic to nsaids and morphine and related.  MEDICATIONS:  Current Outpatient Medications  Medication Sig Dispense Refill   escitalopram (LEXAPRO) 5 MG tablet Take 1 tablet (5 mg total) by mouth daily. 90 tablet 1   gabapentin (NEURONTIN) 100 MG capsule Take 1 capsule (100 mg total) by mouth 3 (three) times daily. 270 capsule 1   hydrOXYzine (ATARAX) 25 MG tablet Take 1 tablet (25 mg total) by mouth every 8 (eight) hours as needed for anxiety. 60 tablet 0   spironolactone (ALDACTONE) 50 MG tablet Take 1 tablet (50 mg total) by mouth daily. 30 tablet 5   No current facility-administered medications for this visit.    REVIEW OF SYSTEMS:   Constitutional: ( - ) fevers, ( - )  chills , ( - ) night sweats Eyes: ( - ) blurriness of vision, ( - ) double vision, ( - ) watery eyes Ears, nose, mouth, throat, and face: ( - ) mucositis, ( - ) sore throat Respiratory: ( - ) cough, ( - ) dyspnea, ( - ) wheezes Cardiovascular: ( - ) palpitation, ( - ) chest discomfort, ( - ) lower extremity swelling Gastrointestinal:  ( - ) nausea, ( - ) heartburn, ( - ) change in bowel habits Skin: ( - ) abnormal skin  rashes Lymphatics: ( - ) new lymphadenopathy, ( - ) easy bruising Neurological: ( - ) numbness, ( - ) tingling, ( - ) new weaknesses Behavioral/Psych: ( - ) mood change, ( - ) new changes  All other systems were reviewed with the patient and are negative.  PHYSICAL EXAMINATION:  Vitals:   01/28/22 0905  BP: 109/63  Pulse: (!) 56  Resp: 16  Temp: 98.1 F (36.7 C)  SpO2: 100%   Filed Weights   01/28/22 0905  Weight: 162 lb 3.2 oz (73.6 kg)  GENERAL: well appearing middle-aged Caucasian female in NAD  SKIN: skin color, texture, turgor are normal, no rashes or significant lesions EYES: conjunctiva are pink and non-injected, sclera clear LUNGS: clear to auscultation and percussion with normal breathing effort HEART: regular rate & rhythm and no murmurs and no lower extremity edema Musculoskeletal: no cyanosis of digits and no clubbing  PSYCH: alert & oriented x 3, fluent speech NEURO: no focal motor/sensory deficits  LABORATORY DATA:  I have reviewed the data as listed CBC Latest Ref Rng & Units 01/12/2022 11/26/2021 05/16/2021  WBC 4.0 - 10.5 K/uL 4.8 3.7 8.1  Hemoglobin 12.0 - 15.0 g/dL 8.6 Repeated and verified X2.(L) 6.9(LL) 9.5(L)  Hematocrit 36.0 - 46.0 % 27.9(L) 23.4(L) 26.3(L)  Platelets 150.0 - 400.0 K/uL 81.0 Repeated and verified X2.(L) 104(L) 201    CMP Latest Ref Rng & Units 01/12/2022 11/26/2021 09/03/2021  Glucose 70 - 99 mg/dL 85 93 90  BUN 6 - 23 mg/dL 13 14 11   Creatinine 0.40 - 1.20 mg/dL 0.59 0.72 0.66  Sodium 135 - 145 mEq/L 135 138 136  Potassium 3.5 - 5.1 mEq/L 4.0 4.2 4.2  Chloride 96 - 112 mEq/L 101 101 99  CO2 19 - 32 mEq/L 25 24 23   Calcium 8.4 - 10.5 mg/dL 9.0 9.1 9.1  Total Protein 6.0 - 8.3 g/dL 7.6 - 7.3  Total Bilirubin 0.2 - 1.2 mg/dL 1.0 - 1.1  Alkaline Phos 39 - 117 U/L 93 - 84  AST 0 - 37 U/L 34 - 37  ALT 0 - 35 U/L 24 - 20     ASSESSMENT & PLAN Brandi James 41 y.o. female with medical history significant for hypertension,  depression, liver disease and panic attacks who presents for evaluation of iron deficiency anemia.   After review of the labs, review of the records, and discussion with the patient the patients findings are most consistent with iron deficiency anemia secondary gastric bypass surgery.  Additionally she has thrombocytopenia which appears to be secondary to cirrhosis.  We will order full iron studies today in order to confirm that this is the cause of her iron deficiency anemia.  I do not believe she has any abnormal bleeding only normal menstrual cycles in the setting of gastric bypass surgery and therefore does not absorb iron medically well.  We will plan to administer IV iron therapy if she is found to have continued iron deficiency.  The patient voiced understanding of this plan moving forward.  #Microcytic Anemia # History of Gastric Bipass Surgery  --No overt evidence of varices on EGD.  Source of blood loss is thought to be menstrual bleeding with poor absorption due to gastric bypass surgery. --We will order baseline labs to include CBC, CMP, iron panel, and ferritin --If patient is found to have iron deficiency anemia we will need to proceed with IV iron therapy to replete her stores --Return to clinic in 3 months or sooner if IV iron therapy is required.  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 Tarri Glenn in gastroenterology --No reports of varices on EGD --Can consider TPO receptor agonist therapy if platelets drop too low for a medical procedure  Orders Placed This Encounter  Procedures   CBC with Differential ([facility] Only)    Standing Status:   Future    Number of Occurrences:   1    Standing Expiration Date:   01/28/2023  CMP ([facility] only)    Standing Status:   Future    Number of Occurrences:   1    Standing Expiration Date:   01/28/2023    Ferritin    Standing Status:   Future    Number of Occurrences:   1    Standing Expiration Date:   01/28/2023   Iron and Iron Binding Capacity ([facility] only)    Standing Status:   Future    Number of Occurrences:   1    Standing Expiration Date:   01/28/2023   Retic Panel    Standing Status:   Future    Number of Occurrences:   1    Standing Expiration Date:   01/28/2023   Vitamin B12    Standing Status:   Future    Number of Occurrences:   1    Standing Expiration Date:   01/28/2023   Methylmalonic acid, serum    Standing Status:   Future    Number of Occurrences:   1    Standing Expiration Date:   01/28/2023    All questions were answered. The patient knows to call the clinic with any problems, questions or concerns.  A total of more than 60 minutes were spent on this encounter with face-to-face time and non-face-to-face time, including preparing to see the patient, ordering tests and/or medications, counseling the patient and coordination of care as outlined above.   Ledell Peoples, MD Department of Hematology/Oncology [place] [facility] Phone: [phone] Pager: [phone] Email: [email or web address]  01/28/2022 10:18 AM

## 2022-02-01 ENCOUNTER — Other Ambulatory Visit: Payer: Self-pay | Admitting: Hematology and Oncology

## 2022-02-01 DIAGNOSIS — D500 Iron deficiency anemia secondary to blood loss (chronic): Secondary | ICD-10-CM

## 2022-02-01 LAB — METHYLMALONIC ACID, SERUM: Methylmalonic Acid, Quantitative: 226 nmol/L (ref 0–378)

## 2022-02-02 ENCOUNTER — Telehealth: Payer: Self-pay | Admitting: Pharmacy Technician

## 2022-02-02 ENCOUNTER — Telehealth: Payer: Self-pay | Admitting: *Deleted

## 2022-02-02 NOTE — Telephone Encounter (Addendum)
Contacted patient per MD request with message below. Patient verbalized understanding of information.    ----- Message ----- From: Orson Slick, MD Sent: 02/01/2022   8:16 PM EST To: Otila Kluver, RN  Please let Mrs. Coffelt know that her labs are consistent with severe iron deficiency anemia. We have sent request to [place] to have IV iron scheduled.

## 2022-02-02 NOTE — Telephone Encounter (Signed)
Dr. Leonides Schanz,  Auth Submission: Denied Payer: [facility] Medication & CPT/J Code(s) submitted: MONOFERRIC Route of submission (phone, fax, portal): PORTAL Auth type: Buy/Bill Units/visits requested: 1  Denied due to patient has not tried and or failed step therapy. Venofer Infed Ferrlecit  Would you like to try venofer??? Please re-enter therapy plan if agreeable and we will schedule the patient as soon as possible

## 2022-02-17 ENCOUNTER — Other Ambulatory Visit: Payer: Self-pay | Admitting: Pharmacy Technician

## 2022-02-17 NOTE — Telephone Encounter (Signed)
Jaci Standard, MD  Amalia Greenhouse, CPhT; Desma Mcgregor, [facility] OK to proceed with venofer 200mg  IV q 7 days x 5 doses

## 2022-02-19 ENCOUNTER — Ambulatory Visit (INDEPENDENT_AMBULATORY_CARE_PROVIDER_SITE_OTHER): Payer: 59

## 2022-02-19 ENCOUNTER — Other Ambulatory Visit: Payer: Self-pay

## 2022-02-19 VITALS — BP 104/58 | HR 99 | Temp 98.4°F | Resp 16 | Ht 65.0 in | Wt 167.8 lb

## 2022-02-19 DIAGNOSIS — D500 Iron deficiency anemia secondary to blood loss (chronic): Secondary | ICD-10-CM

## 2022-02-19 MED ORDER — EPINEPHRINE 0.3 MG/0.3ML IJ SOAJ: 0.3000 mg | Freq: Once | INTRAMUSCULAR | Status: DC | PRN

## 2022-02-19 MED ORDER — DIPHENHYDRAMINE HCL 50 MG/ML IJ SOLN: 50.0000 mg | Freq: Once | INTRAMUSCULAR | Status: DC | PRN

## 2022-02-19 MED ORDER — ALBUTEROL SULFATE HFA 108 (90 BASE) MCG/ACT IN AERS: 2.0000 | INHALATION_SPRAY | Freq: Once | RESPIRATORY_TRACT | Status: DC | PRN

## 2022-02-19 MED ORDER — SODIUM CHLORIDE 0.9 % IV SOLN: Freq: Once | INTRAVENOUS | Status: DC | PRN

## 2022-02-19 MED ORDER — METHYLPREDNISOLONE SODIUM SUCC 125 MG IJ SOLR: 125.0000 mg | Freq: Once | INTRAMUSCULAR | Status: DC | PRN

## 2022-02-19 MED ORDER — SODIUM CHLORIDE 0.9 % IV SOLN
200.0000 mg | Freq: Once | INTRAVENOUS | Status: AC
  Administered 2022-02-19: 200 mg via INTRAVENOUS
  Filled 2022-02-19: qty 10

## 2022-02-19 MED ORDER — FAMOTIDINE IN NACL 20-0.9 MG/50ML-% IV SOLN: 20.0000 mg | Freq: Once | INTRAVENOUS | Status: DC | PRN

## 2022-02-19 NOTE — Progress Notes (Signed)
Diagnosis: Iron Deficiency Anemia ? ?Provider:  Chilton Greathouse, MD ? ?Procedure: Infusion ? ?IV Type: Peripheral, IV Location: L Forearm ? ?Venofer (Iron Sucrose), Dose: 200 mg ? ?Infusion Start Time: 1407 ? ?Infusion Stop Time: 1428 ? ?Post Infusion IV Care: Observation period completed and Peripheral IV Discontinued ? ?Discharge: Condition: Good, Destination: Home . AVS provided to patient.  ? ?Performed by:  Nat Math, RN  ?  ?

## 2022-02-26 ENCOUNTER — Other Ambulatory Visit: Payer: Self-pay

## 2022-02-26 ENCOUNTER — Ambulatory Visit (INDEPENDENT_AMBULATORY_CARE_PROVIDER_SITE_OTHER): Payer: 59

## 2022-02-26 VITALS — BP 98/58 | HR 65 | Temp 98.5°F | Resp 18 | Ht 65.0 in | Wt 167.0 lb

## 2022-02-26 DIAGNOSIS — D500 Iron deficiency anemia secondary to blood loss (chronic): Secondary | ICD-10-CM

## 2022-02-26 MED ORDER — DIPHENHYDRAMINE HCL 50 MG/ML IJ SOLN: 50.0000 mg | Freq: Once | INTRAMUSCULAR | Status: DC | PRN

## 2022-02-26 MED ORDER — EPINEPHRINE 0.3 MG/0.3ML IJ SOAJ: 0.3000 mg | Freq: Once | INTRAMUSCULAR | Status: DC | PRN

## 2022-02-26 MED ORDER — SODIUM CHLORIDE 0.9 % IV SOLN
200.0000 mg | Freq: Once | INTRAVENOUS | Status: AC
  Administered 2022-02-26: 15:00:00 200 mg via INTRAVENOUS
  Filled 2022-02-26: qty 10

## 2022-02-26 MED ORDER — SODIUM CHLORIDE 0.9 % IV SOLN: Freq: Once | INTRAVENOUS | Status: DC | PRN

## 2022-02-26 MED ORDER — FAMOTIDINE IN NACL 20-0.9 MG/50ML-% IV SOLN: 20.0000 mg | Freq: Once | INTRAVENOUS | Status: DC | PRN

## 2022-02-26 MED ORDER — ALBUTEROL SULFATE HFA 108 (90 BASE) MCG/ACT IN AERS: 2.0000 | INHALATION_SPRAY | Freq: Once | RESPIRATORY_TRACT | Status: DC | PRN

## 2022-02-26 MED ORDER — METHYLPREDNISOLONE SODIUM SUCC 125 MG IJ SOLR: 125.0000 mg | Freq: Once | INTRAMUSCULAR | Status: DC | PRN

## 2022-02-26 NOTE — Progress Notes (Signed)
Diagnosis: Iron Deficiency Anemia ? ?Provider:  Chilton Greathouse, MD ? ?Procedure: Infusion ? ?IV Type: Peripheral, IV Location: L Antecubital ? ?Venofer (Iron Sucrose), Dose: 200 mg ? ?Infusion Start Time: 1500 ? ?Infusion Stop Time: 1515 ? ?Post Infusion IV Care: Peripheral IV Discontinued ? ?Discharge: Condition: Good, Destination: Home . AVS provided to patient.  ? ?Performed by:  Marilynn Rail, RN  ?  ?

## 2022-03-05 ENCOUNTER — Other Ambulatory Visit: Payer: Self-pay

## 2022-03-05 ENCOUNTER — Ambulatory Visit (INDEPENDENT_AMBULATORY_CARE_PROVIDER_SITE_OTHER): Payer: 59

## 2022-03-05 VITALS — BP 113/73 | HR 63 | Temp 98.3°F | Resp 16 | Ht 65.0 in | Wt 170.4 lb

## 2022-03-05 DIAGNOSIS — D500 Iron deficiency anemia secondary to blood loss (chronic): Secondary | ICD-10-CM

## 2022-03-05 MED ORDER — SODIUM CHLORIDE 0.9 % IV SOLN
200.0000 mg | Freq: Once | INTRAVENOUS | Status: AC
  Administered 2022-03-05: 200 mg via INTRAVENOUS
  Filled 2022-03-05: qty 10

## 2022-03-05 NOTE — Progress Notes (Signed)
Diagnosis: Iron Deficiency Anemia ? ?Provider:  Marshell Garfinkel, MD ? ?Procedure: Infusion ? ?IV Type: Peripheral, IV Location: R Antecubital ? ?Venofer (Iron Sucrose), Dose: 200 mg ? ?Infusion Start Time: [id number] ? ?Infusion Stop Time: [id number] ? ?Post Infusion IV Care: Peripheral IV Discontinued ? ?Discharge: Condition: Good, Destination: Home . AVS provided to patient.  ? ?Performed by:  Paul Dykes, RN  ?  ?

## 2022-03-09 ENCOUNTER — Encounter: Payer: Self-pay | Admitting: Physician Assistant

## 2022-03-11 ENCOUNTER — Encounter: Payer: Self-pay | Admitting: Physician Assistant

## 2022-03-12 ENCOUNTER — Other Ambulatory Visit: Payer: Self-pay

## 2022-03-12 ENCOUNTER — Ambulatory Visit (INDEPENDENT_AMBULATORY_CARE_PROVIDER_SITE_OTHER): Payer: 59

## 2022-03-12 VITALS — BP 100/63 | HR 67 | Temp 98.4°F | Resp 16 | Ht 65.0 in | Wt 171.8 lb

## 2022-03-12 DIAGNOSIS — D500 Iron deficiency anemia secondary to blood loss (chronic): Secondary | ICD-10-CM | POA: Diagnosis not present

## 2022-03-12 MED ORDER — SODIUM CHLORIDE 0.9 % IV SOLN
200.0000 mg | Freq: Once | INTRAVENOUS | Status: AC
  Administered 2022-03-12: 200 mg via INTRAVENOUS
  Filled 2022-03-12: qty 10

## 2022-03-12 NOTE — Progress Notes (Signed)
Diagnosis: Iron Deficiency Anemia ? ?Provider:  Chilton Greathouse, MD ? ?Procedure: Infusion ? ?IV Type: Peripheral, IV Location: R Antecubital ? ?Venofer (Iron Sucrose), Dose: 200 mg ? ?Infusion Start Time: 1528 ? ?Infusion Stop Time: 1549 ? ?Post Infusion IV Care: Peripheral IV Discontinued ? ?Discharge: Condition: Good, Destination: Home . AVS provided to patient.  ? ?Performed by:  Marilynn Rail, RN  ?  ?

## 2022-03-19 ENCOUNTER — Ambulatory Visit (INDEPENDENT_AMBULATORY_CARE_PROVIDER_SITE_OTHER): Payer: 59

## 2022-03-19 VITALS — BP 111/72 | HR 81 | Temp 98.3°F | Resp 18 | Ht 65.0 in | Wt 170.2 lb

## 2022-03-19 DIAGNOSIS — D500 Iron deficiency anemia secondary to blood loss (chronic): Secondary | ICD-10-CM

## 2022-03-19 MED ORDER — SODIUM CHLORIDE 0.9 % IV SOLN
200.0000 mg | Freq: Once | INTRAVENOUS | Status: AC
  Administered 2022-03-19: 200 mg via INTRAVENOUS
  Filled 2022-03-19: qty 10

## 2022-03-19 NOTE — Progress Notes (Signed)
Diagnosis: Iron Deficiency Anemia ? ?Provider:  Chilton Greathouse, MD ? ?Procedure: Infusion ? ?IV Type: Peripheral, IV Location: R Antecubital ? ?Venofer (Iron Sucrose), Dose: 200 mg ? ?Infusion Start Time: 08.41 ? ?Infusion Stop Time: 08.58 ? ?Post Infusion IV Care: Peripheral IV Discontinued ? ?Discharge: Condition: Good, Destination: Home . AVS provided to patient.  ? ?Performed by:  Semaj Kham, RN  ?  ?

## 2022-03-30 ENCOUNTER — Inpatient Hospital Stay: Payer: 59

## 2022-03-30 ENCOUNTER — Inpatient Hospital Stay: Payer: 59 | Admitting: Hematology and Oncology

## 2022-03-30 NOTE — Progress Notes (Signed)
? ?Complete physical exam ? ? ?Patient: Brandi James   DOB: September 18, 1981   41 y.o. Female  MRN: [id number] ?Visit Date: 03/31/2022 ? ? ?Chief Complaint  ?Patient presents with  ? Annual Exam  ? ?Subjective  ?  ?Brandi James is a 40 y.o. female who presents today for a complete physical exam.  ?She reports consuming a general diet.  Stays active with work and housework.  She generally feels fairly well. She does not have additional problems to discuss today.  ? ? ?Past Medical History:  ?Diagnosis Date  ? Depression with anxiety   ? Hypertension   ?  01/20/2021  ? Liver disease   ? Panic attacks   ? Substance abuse (HCC)   ? 01/20/2021  ? ?Past Surgical History:  ?Procedure Laterality Date  ? IR PARACENTESIS  05/08/2021  ? LAPAROSCOPIC CHOLECYSTECTOMY  2009  ? LAPAROSCOPIC GASTRIC BYPASS N/A 2003  ? [facility].  Dr Adele Dan  ? PANNICULECTOMY  2004  ? To address excessive skin following weight loss  ? ?Social History  ? ?Socioeconomic History  ? Marital status: Single  ?  Spouse name: Not on file  ? Number of children: Not on file  ? Years of education: Not on file  ? Highest education level: Not on file  ?Occupational History  ? Not on file  ?Tobacco Use  ? Smoking status: Former  ?  Packs/day: 0.50  ?  Types: Cigarettes  ?  Quit date: 05/07/2021  ?  Years since quitting: 0.8  ? Smokeless tobacco: Never  ?Vaping Use  ? Vaping Use: Never used  ?Substance and Sexual Activity  ? Alcohol use: Not Currently  ?  Alcohol/week: 12.0 standard drinks  ?  Types: 12 Glasses of wine per week  ?  Comment: 2 bottles of wine daily *  ? Drug use: No  ? Sexual activity: Not Currently  ?  Birth control/protection: None  ?Other Topics Concern  ? Not on file  ?Social History Narrative  ? Not on file  ? ?Social Determinants of Health  ? ?Financial Resource Strain: Not on file  ?Food Insecurity: Not on file  ?Transportation Needs: Not on file  ?Physical Activity: Not on file  ?Stress: Not on file  ?Social Connections: Not on file  ?Intimate  Partner Violence: Not on file  ? ? ? ?Medications: ?Outpatient Medications Prior to Visit  ?Medication Sig  ? escitalopram (LEXAPRO) 5 MG tablet Take 1 tablet (5 mg total) by mouth daily.  ? gabapentin (NEURONTIN) 100 MG capsule Take 1 capsule (100 mg total) by mouth 3 (three) times daily.  ? hydrOXYzine (ATARAX) 25 MG tablet Take 1 tablet (25 mg total) by mouth every 8 (eight) hours as needed for anxiety.  ? spironolactone (ALDACTONE) 50 MG tablet Take 1 tablet (50 mg total) by mouth daily.  ? ?No facility-administered medications prior to visit.  ? ? ?Review of Systems ?Review of Systems:  ?A fourteen system review of systems was performed and found to be positive as per HPI. ? ? ?Last CBC ?Lab Results  ?Component Value Date  ? WBC 4.1 01/28/2022  ? HGB 9.0 (L) 01/28/2022  ? HCT 30.1 (L) 01/28/2022  ? MCV 73.4 (L) 01/28/2022  ? MCH 22.0 (L) 01/28/2022  ? RDW 23.4 (H) 01/28/2022  ? PLT 128 (L) 01/28/2022  ? ?Last metabolic panel ?Lab Results  ?Component Value Date  ? GLUCOSE 82 01/28/2022  ? NA 139 01/28/2022  ? K 4.0 01/28/2022  ?  CL 107 01/28/2022  ? CO2 27 01/28/2022  ? BUN 14 01/28/2022  ? CREATININE 0.57 01/28/2022  ? GFRNONAA >60 01/28/2022  ? CALCIUM 9.4 01/28/2022  ? PHOS 4.2 05/08/2021  ? PROT 7.5 01/28/2022  ? ALBUMIN 4.1 01/28/2022  ? LABGLOB 3.2 09/03/2021  ? AGRATIO 1.3 09/03/2021  ? BILITOT 0.9 01/28/2022  ? ALKPHOS 77 01/28/2022  ? AST 35 01/28/2022  ? ALT 28 01/28/2022  ? ANIONGAP 5 01/28/2022  ? ?Last lipids ?Lab Results  ?Component Value Date  ? CHOL 191 12/26/2020  ? HDL 37 (L) 12/26/2020  ? LDLCALC 132 (H) 12/26/2020  ? TRIG 119 12/26/2020  ? CHOLHDL 5.2 (H) 12/26/2020  ? ?Last hemoglobin A1c ?Lab Results  ?Component Value Date  ? HGBA1C 5.0 11/26/2021  ? ?Last thyroid functions ?Lab Results  ?Component Value Date  ? TSH 3.260 12/26/2020  ? T3TOTAL 104 01/23/2019  ? ?Last vitamin D ?No results found for: 25OHVITD2, 25OHVITD3, VD25OH ? Objective  ?  ?BP 104/63   Pulse 64   Temp 98.3 ?F (36.8  ?C)   Ht 5\' 5"  (1.651 m)   Wt 173 lb (78.5 kg)   SpO2 98%   BMI 28.79 kg/m?  ?BP Readings from Last 3 Encounters:  ?03/31/22 104/63  ?03/19/22 111/72  ?03/12/22 100/63  ? ?Wt Readings from Last 3 Encounters:  ?03/31/22 173 lb (78.5 kg)  ?03/19/22 170 lb 3.2 oz (77.2 kg)  ?03/12/22 171 lb 12.8 oz (77.9 kg)  ? ? ?Physical Exam  ? ?General Appearance:     ?  Alert, cooperative, in no acute distress, appears stated age   ?Head:    Normocephalic, without obvious abnormality, atraumatic  ?Eyes:    PERRL, conjunctiva/corneas clear, EOM's intact, fundi  ?  benign, both eyes  ?Ears:    Normal TM's and external ear canals, both ears  ?Nose:   Nares normal, septum midline, mucosa normal, no drainage  ?  or sinus tenderness  ?Throat:   Lips, mucosa, and tongue normal; teeth and gums normal  ?Neck:   Supple, symmetrical, trachea midline, no adenopathy;  ?  thyroid:  no enlargement/tenderness/nodules; no JVD  ?Back:     Symmetric, no curvature, ROM normal, no CVA tenderness  ?Lungs:     Clear to auscultation bilaterally, respirations unlabored  ?Chest Wall:    No tenderness or deformity  ? Heart:    Normal heart rate. Normal rhythm. No murmurs, rubs, or gallops.  ?  ?Breast Exam:    normal appearance, no masses or tenderness  ?Abdomen:     Soft, non-tender, bowel sounds active all four quadrants,  ?  no masses  ?Pelvic:    external genitalia normal, no adnexal masses or tenderness, no cervical motion tenderness, positive findings: cystocele or vaginal discharge:  white, thin, and malodorous, and uterus normal size, shape, and consistency  ?Extremities:   All extremities are intact. No cyanosis or edema  ?Pulses:   2+ and symmetric all extremities  ?Skin:   Skin color, texture, turgor normal, no rashes or lesions  ?Lymph nodes:   Cervical, supraclavicular, and axillary nodes normal  ?Neurologic:   CNII-XII grossly intact  ? ? ? ?Last depression screening scores ? ?  03/31/2022  ? 10:52 AM 01/27/2022  ?  3:15 PM 11/26/2021  ?  1:49  PM  ?PHQ 2/9 Scores  ?PHQ - 2 Score 1 0 0  ?PHQ- 9 Score 3 2 6   ? ?Last fall risk screening ? ?  03/31/2022  ?  10:52 AM  ?Fall Risk   ?Falls in the past year? 0  ?Number falls in past yr: 0  ?Injury with Fall? 0  ?Risk for fall due to : No Fall Risks  ?Follow up Falls evaluation completed  ? ? ? ?No results found for any visits on 03/31/22. ? Assessment & Plan  ?  ?Routine Health Maintenance and Physical Exam ? ?Exercise Activities and Dietary recommendations ?-Heart healthy diet low in fat and carbohydrates.  ? ?Immunization History  ?Administered Date(s) Administered  ? Hep A / Hep B 06/11/2021, 07/11/2021  ? Hepatitis A, Adult 01/12/2022  ? Hepatitis B, ped/adol 01/12/2022, 01/12/2022  ? Influenza,inj,Quad PF,6+ Mos 01/21/2021, 08/26/2021  ? Pneumococcal Polysaccharide-23 06/11/2021  ? Tdap 05/20/2011, 03/31/2022  ? ? ?Health Maintenance  ?Topic Date Due  ? COVID-19 Vaccine (1) Never done  ? PAP SMEAR-Modifier  Never done  ? INFLUENZA VACCINE  07/21/2022  ? TETANUS/TDAP  03/31/2032  ? Hepatitis C Screening  Completed  ? HIV Screening  Completed  ? HPV VACCINES  Aged Out  ? ? ?Discussed health benefits of physical activity, and encouraged her to engage in regular exercise appropriate for her age and condition. ? ?Problem List Items Addressed This Visit   ? ?  ? Other  ? Healthcare maintenance - Primary  ? Relevant Orders  ? TSH  ? Lipid panel  ? Hemoglobin A1c  ? Comprehensive metabolic panel  ? CBC with Differential/Platelet  ? Iron deficiency anemia due to chronic blood loss  ? Relevant Orders  ? CBC with Differential/Platelet  ? ?Other Visit Diagnoses   ? ? Need for Tdap vaccination      ? Relevant Orders  ? Tdap vaccine greater than or equal to 7yo IM (Completed)  ? Screening for cervical cancer      ? Relevant Orders  ? Cytology - PAP( [facility])  ? Screening for endocrine, nutritional, metabolic and immunity disorder      ? Relevant Orders  ? TSH  ? Lipid panel  ? Hemoglobin A1c  ? Comprehensive metabolic  panel  ? CBC with Differential/Platelet  ? ?  ? ?Pap collected. ?Recommend to schedule lab visit tomorrow for routine fasting labs. Contacted the lab and unable to add BV test with pap so will collect urine sample

## 2022-03-31 ENCOUNTER — Encounter: Payer: Self-pay | Admitting: Physician Assistant

## 2022-03-31 ENCOUNTER — Other Ambulatory Visit (HOSPITAL_COMMUNITY)
Admission: RE | Admit: 2022-03-31 | Discharge: 2022-03-31 | Disposition: A | Discharge status: Home / Self Care | Payer: 59 | Source: Ambulatory Visit | Attending: Physician Assistant | Admitting: Physician Assistant

## 2022-03-31 ENCOUNTER — Ambulatory Visit (INDEPENDENT_AMBULATORY_CARE_PROVIDER_SITE_OTHER): Payer: 59 | Admitting: Physician Assistant

## 2022-03-31 VITALS — BP 104/63 | HR 64 | Temp 98.3°F | Ht 65.0 in | Wt 173.0 lb

## 2022-03-31 DIAGNOSIS — Z13228 Encounter for screening for other metabolic disorders: Secondary | ICD-10-CM

## 2022-03-31 DIAGNOSIS — Z1321 Encounter for screening for nutritional disorder: Secondary | ICD-10-CM

## 2022-03-31 DIAGNOSIS — Z130 Encounter for screening for diseases of the blood and blood-forming organs and certain disorders involving the immune mechanism: Secondary | ICD-10-CM

## 2022-03-31 DIAGNOSIS — Z23 Encounter for immunization: Secondary | ICD-10-CM

## 2022-03-31 DIAGNOSIS — Z0000 Encounter for general adult medical examination without abnormal findings: Secondary | ICD-10-CM | POA: Diagnosis not present

## 2022-03-31 DIAGNOSIS — Z124 Encounter for screening for malignant neoplasm of cervix: Secondary | ICD-10-CM

## 2022-03-31 DIAGNOSIS — D500 Iron deficiency anemia secondary to blood loss (chronic): Secondary | ICD-10-CM

## 2022-03-31 DIAGNOSIS — Z1329 Encounter for screening for other suspected endocrine disorder: Secondary | ICD-10-CM

## 2022-03-31 NOTE — Patient Instructions (Signed)

## 2022-04-06 ENCOUNTER — Other Ambulatory Visit: Payer: Self-pay

## 2022-04-06 ENCOUNTER — Inpatient Hospital Stay: Payer: 59 | Attending: Hematology and Oncology

## 2022-04-06 ENCOUNTER — Other Ambulatory Visit: Payer: 59

## 2022-04-06 ENCOUNTER — Ambulatory Visit: Payer: 59 | Admitting: Hematology and Oncology

## 2022-04-06 ENCOUNTER — Inpatient Hospital Stay (HOSPITAL_BASED_OUTPATIENT_CLINIC_OR_DEPARTMENT_OTHER): Payer: 59 | Admitting: Hematology and Oncology

## 2022-04-06 ENCOUNTER — Other Ambulatory Visit: Payer: Self-pay | Admitting: Hematology and Oncology

## 2022-04-06 VITALS — BP 103/69 | HR 60 | Temp 97.8°F | Resp 16 | Wt 174.2 lb

## 2022-04-06 DIAGNOSIS — D500 Iron deficiency anemia secondary to blood loss (chronic): Secondary | ICD-10-CM

## 2022-04-06 DIAGNOSIS — D509 Iron deficiency anemia, unspecified: Secondary | ICD-10-CM | POA: Diagnosis present

## 2022-04-06 DIAGNOSIS — Z79899 Other long term (current) drug therapy: Secondary | ICD-10-CM | POA: Insufficient documentation

## 2022-04-06 DIAGNOSIS — K7460 Unspecified cirrhosis of liver: Secondary | ICD-10-CM | POA: Diagnosis not present

## 2022-04-06 DIAGNOSIS — Z9884 Bariatric surgery status: Secondary | ICD-10-CM | POA: Diagnosis not present

## 2022-04-06 DIAGNOSIS — D696 Thrombocytopenia, unspecified: Secondary | ICD-10-CM | POA: Insufficient documentation

## 2022-04-06 LAB — IRON AND IRON BINDING CAPACITY (CC-WL,HP ONLY)
Iron: 37 ug/dL (ref 28–170)
Saturation Ratios: 7 % — ABNORMAL LOW (ref 10.4–31.8)
TIBC: 517 ug/dL — ABNORMAL HIGH (ref 250–450)
UIBC: 480 ug/dL — ABNORMAL HIGH (ref 148–442)

## 2022-04-06 LAB — CMP (CANCER CENTER ONLY)
ALT: 32 U/L (ref 0–44)
AST: 41 U/L (ref 15–41)
Albumin: 4 g/dL (ref 3.5–5.0)
Alkaline Phosphatase: 96 U/L (ref 38–126)
Anion gap: 5 (ref 5–15)
BUN: 15 mg/dL (ref 6–20)
CO2: 27 mmol/L (ref 22–32)
Calcium: 8.7 mg/dL — ABNORMAL LOW (ref 8.9–10.3)
Chloride: 105 mmol/L (ref 98–111)
Creatinine: 0.63 mg/dL (ref 0.44–1.00)
GFR, Estimated: >60 mL/min (ref >60)
Glucose, Bld: 95 mg/dL (ref 70–99)
Potassium: 4.1 mmol/L (ref 3.5–5.1)
Sodium: 137 mmol/L (ref 135–145)
Total Bilirubin: 0.7 mg/dL (ref 0.3–1.2)
Total Protein: 7.3 g/dL (ref 6.5–8.1)

## 2022-04-06 LAB — CBC WITH DIFFERENTIAL (CANCER CENTER ONLY)
Abs Immature Granulocytes: 0.01 10*3/uL (ref 0.00–0.07)
Basophils Absolute: 0 10*3/uL (ref 0.0–0.1)
Basophils Relative: 1 %
Eosinophils Absolute: 0.1 10*3/uL (ref 0.0–0.5)
Eosinophils Relative: 2 %
HCT: 33.1 % — ABNORMAL LOW (ref 36.0–46.0)
Hemoglobin: 10.9 g/dL — ABNORMAL LOW (ref 12.0–15.0)
Immature Granulocytes: 0 %
Lymphocytes Relative: 31 %
Lymphs Abs: 1.5 10*3/uL (ref 0.7–4.0)
MCH: 28.1 pg (ref 26.0–34.0)
MCHC: 32.9 g/dL (ref 30.0–36.0)
MCV: 85.3 fL (ref 80.0–100.0)
Monocytes Absolute: 0.6 10*3/uL (ref 0.1–1.0)
Monocytes Relative: 11 %
Neutro Abs: 2.7 10*3/uL (ref 1.7–7.7)
Neutrophils Relative %: 55 %
Platelet Count: 97 10*3/uL — ABNORMAL LOW (ref 150–400)
RBC: 3.88 MIL/uL (ref 3.87–5.11)
RDW: 22.5 % — ABNORMAL HIGH (ref 11.5–15.5)
WBC Count: 4.9 10*3/uL (ref 4.0–10.5)
nRBC: 0 % (ref 0.0–0.2)

## 2022-04-06 LAB — RETIC PANEL
Immature Retic Fract: 11.4 % (ref 2.3–15.9)
RBC.: 3.93 MIL/uL (ref 3.87–5.11)
Retic Count, Absolute: 40.9 10*3/uL (ref 19.0–186.0)
Retic Ct Pct: 1 % (ref 0.4–3.1)
Reticulocyte Hemoglobin: 34.4 pg (ref >27.9)

## 2022-04-06 LAB — FERRITIN: Ferritin: 48 ng/mL (ref 11–307)

## 2022-04-06 NOTE — Progress Notes (Signed)
?[facility] ?[phone]   [phone] ? ?PROGRESS NOTE ? ?Patient Care Team: ?Peggye Fothergill as PCP - General (Physician Assistant) ?Karie Soda, MD as Consulting Physician (General Surgery) ? ?Hematological/Oncological History ?#Iron deficiency anemia in the setting of gastric bypass ?# Thrombocytopenia  ?11/26/2021: WBC 3.7, Hgb 6.9, Plt 104, MCV 67 ?01/12/2022: WBC 4.8, Hgb 8.6, Plt 81, MCV 71  ?01/28/2022: establish care with Dr. Leonides Schanz  ?02/19/2022-03/19/2022: Received IV iron sucrose 200 mg x 5 doses ? ?Interval History:  ?Alben Spittle Forsee 41 y.o. female with medical history significant for iron deficiency anemia and thrombocytopenia who presents for a follow up visit. The patient's last visit was on 01/28/2022 at which time she established care. In the interim since the last visit she has completed 5 doses of IV iron therapy.  ? ?On exam today Ms. Bergthold notes that she tolerated the iron infusions quite well.  She did have a slight headache after her first infusion but tolerated the otherwise quite fine.  She notes a modest boost in her energy levels.  She notes that she is not as tired and her watch has been telling her she has been sleeping better and more physically active.  She is not having any issues with lightheadedness, dizziness, or shortness of breath. ? ?Of note in the interim since her last visit she did have an IUD placed on Tuesday.  She reports that she was told it may increase the heaviness of her menstrual cycles.  She is currently on her menstrual cycle right now which has been particularly heavy.  She notes is not having any other bleeding from other sources.  She currently denies any fevers, chills, sweats, nausea, vomiting or diarrhea.  Full 10 point ROS is listed below. ? ?MEDICAL HISTORY:  ?Past Medical History:  ?Diagnosis Date  ? Depression with anxiety   ? Hypertension   ?  01/20/2021  ? Liver disease   ? Panic attacks   ? Substance abuse (HCC)   ?  01/20/2021  ? ? ?SURGICAL HISTORY: ?Past Surgical History:  ?Procedure Laterality Date  ? IR PARACENTESIS  05/08/2021  ? LAPAROSCOPIC CHOLECYSTECTOMY  2009  ? LAPAROSCOPIC GASTRIC BYPASS N/A 2003  ? [facility].  Dr Adele Dan  ? PANNICULECTOMY  2004  ? To address excessive skin following weight loss  ? ? ?SOCIAL HISTORY: ?Social History  ? ?Socioeconomic History  ? Marital status: Single  ?  Spouse name: Not on file  ? Number of children: Not on file  ? Years of education: Not on file  ? Highest education level: Not on file  ?Occupational History  ? Not on file  ?Tobacco Use  ? Smoking status: Former  ?  Packs/day: 0.50  ?  Types: Cigarettes  ?  Quit date: 05/07/2021  ?  Years since quitting: 0.9  ? Smokeless tobacco: Never  ?Vaping Use  ? Vaping Use: Never used  ?Substance and Sexual Activity  ? Alcohol use: Not Currently  ?  Alcohol/week: 12.0 standard drinks  ?  Types: 12 Glasses of wine per week  ?  Comment: 2 bottles of wine daily *  ? Drug use: No  ? Sexual activity: Not Currently  ?  Birth control/protection: None  ?Other Topics Concern  ? Not on file  ?Social History Narrative  ? Not on file  ? ?Social Determinants of Health  ? ?Financial Resource Strain: Not on file  ?Food Insecurity: Not on file  ?Transportation Needs: Not on file  ?  Physical Activity: Not on file  ?Stress: Not on file  ?Social Connections: Not on file  ?Intimate Partner Violence: Not on file  ? ? ?FAMILY HISTORY: ?Family History  ?Problem Relation Age of Onset  ? Healthy Mother   ? Diabetes Father   ? Hypertension Father   ? Colon cancer Neg Hx   ? Rectal cancer Neg Hx   ? Stomach cancer Neg Hx   ? Esophageal cancer Neg Hx   ? ? ?ALLERGIES:  is allergic to nsaids and morphine and related. ? ?MEDICATIONS:  ?Current Outpatient Medications  ?Medication Sig Dispense Refill  ? escitalopram (LEXAPRO) 5 MG tablet Take 1 tablet (5 mg total) by mouth daily. 90 tablet 1  ? gabapentin (NEURONTIN) 100 MG capsule Take 1 capsule (100 mg total) by mouth 3  (three) times daily. 270 capsule 1  ? hydrOXYzine (ATARAX) 25 MG tablet Take 1 tablet (25 mg total) by mouth every 8 (eight) hours as needed for anxiety. 60 tablet 0  ? spironolactone (ALDACTONE) 50 MG tablet Take 1 tablet (50 mg total) by mouth daily. 30 tablet 5  ? ?No current facility-administered medications for this visit.  ? ? ?REVIEW OF SYSTEMS:   ?Constitutional: ( - ) fevers, ( - )  chills , ( - ) night sweats ?Eyes: ( - ) blurriness of vision, ( - ) double vision, ( - ) watery eyes ?Ears, nose, mouth, throat, and face: ( - ) mucositis, ( - ) sore throat ?Respiratory: ( - ) cough, ( - ) dyspnea, ( - ) wheezes ?Cardiovascular: ( - ) palpitation, ( - ) chest discomfort, ( - ) lower extremity swelling ?Gastrointestinal:  ( - ) nausea, ( - ) heartburn, ( - ) change in bowel habits ?Skin: ( - ) abnormal skin rashes ?Lymphatics: ( - ) new lymphadenopathy, ( - ) easy bruising ?Neurological: ( - ) numbness, ( - ) tingling, ( - ) new weaknesses ?Behavioral/Psych: ( - ) mood change, ( - ) new changes  ?All other systems were reviewed with the patient and are negative. ? ?PHYSICAL EXAMINATION: ? ?Vitals:  ? 04/06/22 1434  ?BP: 103/69  ?Pulse: 60  ?Resp: 16  ?Temp: 97.8 ?F (36.6 ?C)  ?SpO2: 100%  ? ?Filed Weights  ? 04/06/22 1434  ?Weight: 174 lb 3.2 oz (79 kg)  ? ? ?GENERAL: Well-appearing middle-age Caucasian female, alert, no distress and comfortable ?SKIN: skin color, texture, turgor are normal, no rashes or significant lesions ?EYES: conjunctiva are pink and non-injected, sclera clear ?LUNGS: clear to auscultation and percussion with normal breathing effort ?HEART: regular rate & rhythm and no murmurs and no lower extremity edema ?Musculoskeletal: no cyanosis of digits and no clubbing  ?PSYCH: alert & oriented x 3, fluent speech ?NEURO: no focal motor/sensory deficits ? ?LABORATORY DATA:  ?I have reviewed the data as listed ? ?  Latest Ref Rng & Units 04/06/2022  ?  2:14 PM 01/28/2022  ? 10:04 AM 01/12/2022  ?  3:33  PM  ?CBC  ?WBC 4.0 - 10.5 K/uL 4.9   4.1   4.8    ?Hemoglobin 12.0 - 15.0 g/dL 11.910.9   9.0   8.6 Repeated and verified X2.    ?Hematocrit 36.0 - 46.0 % 33.1   30.1   27.9    ?Platelets 150 - 400 K/uL 97   128   81.0 Repeated and verified X2.    ? ? ? ?  Latest Ref Rng & Units 04/06/2022  ?  2:14  PM 01/28/2022  ? 10:04 AM 01/12/2022  ?  3:33 PM  ?CMP  ?Glucose 70 - 99 mg/dL 95   82   85    ?BUN 6 - 20 mg/dL 15   14   13     ?Creatinine 0.44 - 1.00 mg/dL   8.84   1.66    ?Sodium 135 - 145 mmol/L 137   139   135    ?Potassium 3.5 - 5.1 mmol/L 4.1   4.0   4.0    ?Chloride 98 - 111 mmol/L 105   107   101    ?CO2 22 - 32 mmol/L 27   27   25     ?Calcium 8.9 - 10.3 mg/dL 8.7   9.4   9.0    ?Total Protein 6.5 - 8.1 g/dL 7.3   7.5   7.6    ?Total Bilirubin 0.3 - 1.2 mg/dL 0.7   0.9   1.0    ?Alkaline Phos 38 - 126 U/L 96   77   93    ?AST 15 - 41 U/L 41   35   34    ?ALT 0 - 44 U/L 32   28   24    ? ? ?RADIOGRAPHIC STUDIES: ?I have personally reviewed the radiological images as listed and agreed with the findings in the report. ?No results found. ? ?ASSESSMENT & PLAN ?0.63 Dinino 41 y.o. female with medical history significant for iron deficiency anemia and thrombocytopenia who presents for a follow up visit. ? ?#Iron Deficiency Anemia 2/2 to Poor Absorption ?# History of Gastric Bipass Surgery  ?--No overt evidence of varices on EGD.  Source of blood loss is thought to be menstrual bleeding with poor absorption due to gastric bypass surgery. ?--We will repeat labs to include CBC, CMP, iron panel, and ferritin ?--If patient is found to have continued iron deficiency anemia we will need to proceed with replete IV iron therapy to replete her stores ?-- Patient last received IV iron therapy in March 2023.  She received 5 doses of IV iron sucrose 200 mg. ?--Labs today show white blood cell count 4.9, hemoglobin 10.9, MCV 85.3, and platelets of 97.  Iron studies currently pending. ?--Return to clinic in 3 months or sooner if IV  iron therapy is required. ?  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 April 2023 in [place]

## 2022-04-07 ENCOUNTER — Telehealth: Payer: Self-pay | Admitting: Hematology and Oncology

## 2022-04-07 LAB — CYTOLOGY - PAP
Comment: NEGATIVE
Diagnosis: NEGATIVE
High risk HPV: NEGATIVE

## 2022-04-07 NOTE — Telephone Encounter (Signed)
Scheduled per 4/17 los, pt has been called and confirmed  ?

## 2022-05-11 ENCOUNTER — Other Ambulatory Visit (HOSPITAL_COMMUNITY): Payer: 59

## 2022-05-29 ENCOUNTER — Ambulatory Visit (HOSPITAL_COMMUNITY): Payer: 59

## 2022-06-24 ENCOUNTER — Telehealth: Payer: Self-pay | Admitting: Physician Assistant

## 2022-06-24 NOTE — Telephone Encounter (Signed)
Patient states she had her annual physical w/ pap done in April and birth control was discussed however she did not ask for it at that time and now is requesting something. I explained she would more than likely need a visit but I would send a message back. Please advise.

## 2022-06-25 NOTE — Telephone Encounter (Signed)
Patient will need to schedule a visit to discuss birth control with provider. AS, CMA

## 2022-06-29 ENCOUNTER — Ambulatory Visit (HOSPITAL_COMMUNITY): Payer: 59

## 2022-07-01 NOTE — Telephone Encounter (Signed)
Patient aware.

## 2022-07-03 ENCOUNTER — Telehealth: Payer: Self-pay | Admitting: Hematology and Oncology

## 2022-07-03 NOTE — Telephone Encounter (Signed)
Per 7/14 phone line pt called to r/s appointment  R/s per pt request

## 2022-07-06 ENCOUNTER — Telehealth: Payer: Self-pay

## 2022-07-06 ENCOUNTER — Inpatient Hospital Stay: Payer: 59

## 2022-07-06 ENCOUNTER — Inpatient Hospital Stay: Payer: 59 | Admitting: Hematology and Oncology

## 2022-07-06 NOTE — Telephone Encounter (Signed)
Unable to leave message for patient to call back, vm box full.   Brandi Danas, MD  Lucky Cowboy, RN  Please schedule MRI and office follow-up with me after that.   Thanks.   KLB

## 2022-07-07 NOTE — Telephone Encounter (Signed)
Unable to leave message for patient, vm box full. 

## 2022-07-08 ENCOUNTER — Other Ambulatory Visit: Payer: Self-pay

## 2022-07-08 DIAGNOSIS — K7031 Alcoholic cirrhosis of liver with ascites: Secondary | ICD-10-CM

## 2022-07-08 NOTE — Telephone Encounter (Signed)
Unable to reach patient x 3. Message sent to patient via mychart in regards to rescheduling MRI & f/u. MRI orders placed.

## 2022-07-14 ENCOUNTER — Encounter: Payer: Self-pay | Admitting: Physician Assistant

## 2022-07-14 ENCOUNTER — Ambulatory Visit (INDEPENDENT_AMBULATORY_CARE_PROVIDER_SITE_OTHER): Payer: 59 | Admitting: Physician Assistant

## 2022-07-14 VITALS — BP 105/68 | HR 62 | Ht 64.96 in | Wt 181.1 lb

## 2022-07-14 DIAGNOSIS — N920 Excessive and frequent menstruation with regular cycle: Secondary | ICD-10-CM

## 2022-07-14 LAB — POCT URINE PREGNANCY: Preg Test, Ur: NEGATIVE

## 2022-07-14 MED ORDER — MEDROXYPROGESTERONE ACETATE 150 MG/ML IM SUSP
150.0000 mg | Freq: Once | INTRAMUSCULAR | Status: AC
  Administered 2022-07-14: 150 mg via INTRAMUSCULAR

## 2022-07-14 MED ORDER — MEDROXYPROGESTERONE ACETATE 150 MG/ML IM SUSP: 150.0000 mg | INTRAMUSCULAR | 3 refills | Status: DC

## 2022-07-14 NOTE — Patient Instructions (Signed)
Medroxyprogesterone Injection (Contraception) ?What is this medication? ?MEDROXYPROGESTERONE (me DROX ee proe JES te rone) prevents ovulation and pregnancy. It belongs to a group of medications called contraceptives. This medication is a progestin hormone. ?This medicine may be used for other purposes; ask your health care provider or pharmacist if you have questions. ?COMMON BRAND NAME(S): Depo-Provera, Depo-subQ Provera 104 ?What should I tell my care team before I take this medication? ?They need to know if you have any of these conditions: ?Asthma ?Blood clots ?Breast cancer or family history of breast cancer ?Depression ?Diabetes ?Eating disorder (anorexia nervosa) ?Heart attack ?High blood pressure ?HIV infection or AIDS ?If you often drink alcohol ?Kidney disease ?Liver disease ?Migraine headaches ?Osteoporosis, weak bones ?Seizures ?Stroke ?Tobacco smoker ?Vaginal bleeding ?An unusual or allergic reaction to medroxyprogesterone, other hormones, medications, foods, dyes, or preservatives ?Pregnant or trying to get pregnant ?Breast-feeding ?How should I use this medication? ?Depo-Provera CI contraceptive injection is given into a muscle. Depo-subQ Provera 104 injection is given under the skin. It is given in a hospital or clinic setting. The injection is usually given during the first 5 days after the start of a menstrual period or 6 weeks after delivery of a baby. ?A patient package insert for the product will be given with each prescription and refill. Be sure to read this information carefully each time. The sheet may change often. ?Talk to your care team about the use of this medication in children. Special care may be needed. These injections have been used in female children who have started having menstrual periods. ?Overdosage: If you think you have taken too much of this medicine contact a poison control center or emergency room at once. ?NOTE: This medicine is only for you. Do not share this medicine  with others. ?What if I miss a dose? ?Keep appointments for follow-up doses. You must get an injection once every 3 months. It is important not to miss your dose. Call your care team if you are unable to keep an appointment. ?What may interact with this medication? ?Antibiotics or medications for infections, especially rifampin and griseofulvin ?Antivirals for HIV or hepatitis ?Aprepitant ?Armodafinil ?Bexarotene ?Bosentan ?Medications for seizures like carbamazepine, felbamate, oxcarbazepine, phenytoin, phenobarbital, primidone, topiramate ?Mitotane ?Modafinil ?St. John's wort ?This list may not describe all possible interactions. Give your health care provider a list of all the medicines, herbs, non-prescription drugs, or dietary supplements you use. Also tell them if you smoke, drink alcohol, or use illegal drugs. Some items may interact with your medicine. ?What should I watch for while using this medication? ?This medication does not protect you against HIV infection (AIDS) or other sexually transmitted diseases. ?Use of this product may cause you to lose calcium from your bones. Loss of calcium may cause weak bones (osteoporosis). Only use this product for more than 2 years if other forms of birth control are not right for you. The longer you use this product for birth control the more likely you will be at risk for weak bones. Ask your care team how you can keep strong bones. ?You may have a change in bleeding pattern or irregular periods. Many females stop having periods while taking this medication. ?If you have received your injections on time, your chance of being pregnant is very low. If you think you may be pregnant, see your care team as soon as possible. ?Tell your care team if you want to get pregnant within the next year. The effect of this medication may last a   long time after you get your last injection. ?What side effects may I notice from receiving this medication? ?Side effects that you should  report to your care team as soon as possible: ?Allergic reactions--skin rash, itching, hives, swelling of the face, lips, tongue, or throat ?Blood clot--pain, swelling, or warmth in the leg, shortness of breath, chest pain ?Gallbladder problems--severe stomach pain, nausea, vomiting, fever ?Increase in blood pressure ?Liver injury--right upper belly pain, loss of appetite, nausea, light-colored stool, dark yellow or brown urine, yellowing skin or eyes, unusual weakness or fatigue ?New or worsening migraines or headaches ?Seizures ?Stroke--sudden numbness or weakness of the face, arm, or leg, trouble speaking, confusion, trouble walking, loss of balance or coordination, dizziness, severe headache, change in vision ?Unusual vaginal discharge, itching, or odor ?Worsening mood, feelings of depression ?Side effects that usually do not require medical attention (report to your care team if they continue or are bothersome): ?Breast pain or tenderness ?Dark patches of the skin on the face or other sun-exposed areas ?Irregular menstrual cycles or spotting ?Nausea ?Weight gain ?This list may not describe all possible side effects. Call your doctor for medical advice about side effects. You may report side effects to FDA at 1-800-FDA-1088. ?Where should I keep my medication? ?This injection is only given by a care team. It will not be stored at home. ?NOTE: This sheet is a summary. It may not cover all possible information. If you have questions about this medicine, talk to your doctor, pharmacist, or health care provider. ?? 2023 Elsevier/Gold Standard (2021-02-09 00:00:00) ? ?

## 2022-07-14 NOTE — Progress Notes (Signed)
  Established patient visit   Patient: Brandi James   DOB: Sep 17, 1981   41 y.o. Female  MRN: [id number] Visit Date: 07/14/2022  Chief Complaint  Patient presents with   Contraception   Subjective    HPI  Patient presents for discussion of birth control. Patient states had copper IUD removed May of this year and wants to start birth control. States since IUD removal her periods have been heavy and painful which seemed to improve the last few months. Periods are now about every 28 days. In the past was on NuvaRing. No hx of migraine    Medications: Outpatient Medications Prior to Visit  Medication Sig   escitalopram (LEXAPRO) 5 MG tablet Take 1 tablet (5 mg total) by mouth daily.   gabapentin (NEURONTIN) 100 MG capsule Take 1 capsule (100 mg total) by mouth 3 (three) times daily.   hydrOXYzine (ATARAX) 25 MG tablet Take 1 tablet (25 mg total) by mouth every 8 (eight) hours as needed for anxiety.   spironolactone (ALDACTONE) 50 MG tablet Take 1 tablet (50 mg total) by mouth daily.   No facility-administered medications prior to visit.    Review of Systems Review of Systems:  A fourteen system review of systems was performed and found to be positive as per HPI.     Objective    BP 105/68   Pulse 62   Ht 5' 4.96" (1.65 m)   Wt 181 lb 1.9 oz (82.2 kg)   LMP 07/14/2022   SpO2 98%   BMI 30.18 kg/m  BP Readings from Last 3 Encounters:  07/14/22 105/68  04/06/22 103/69  03/31/22 104/63   Wt Readings from Last 3 Encounters:  07/14/22 181 lb 1.9 oz (82.2 kg)  04/06/22 174 lb 3.2 oz (79 kg)  03/31/22 173 lb (78.5 kg)    Physical Exam  General:  Pleasant and cooperative, appropriate for stated age.  Neuro:  Alert and oriented,  extra-ocular muscles intact  HEENT:  Normocephalic, atraumatic, neck supple  Skin:  no gross rash, warm, pink. Cardiac:  RRR, S1 S2 Respiratory: CTA B/L  Vascular:  Ext warm, no cyanosis apprec.; cap RF less 2 sec. Psych:  No HI/SI, judgement and  insight good, Euthymic mood. Full Affect.   Results for orders placed or performed in visit on 07/14/22  POCT urine pregnancy  Result Value Ref Range   Preg Test, Ur Negative Negative    Assessment & Plan      Problem List Items Addressed This Visit   None Visit Diagnoses     Menorrhagia with regular cycle    -  Primary   Relevant Medications   medroxyPROGESTERone (DEPO-PROVERA) 150 MG/ML injection   medroxyPROGESTERone (DEPO-PROVERA) injection 150 mg (Completed)   Other Relevant Orders   POCT urine pregnancy (Completed)      Menorrhagia with regular cycle: -Patient has a history of cirrhosis so reocmmend progestin only contraceptive. Patient is agreeable to Depo-Provera. Urine pregnancy negative. Patient UTD with annual physical.    Return in about 3 months (around 10/14/2022) for nurse visit for depo provera injection .        Mayer Masker, PA-C  [facility] at [facility] [phone] (phone) [phone] (fax)  [facility]

## 2022-07-20 ENCOUNTER — Telehealth: Payer: Self-pay | Admitting: Hematology and Oncology

## 2022-07-20 NOTE — Telephone Encounter (Signed)
Per 7/31 phone line pt called to canceled appointment.  Appointment canceled per pt request

## 2022-07-21 ENCOUNTER — Other Ambulatory Visit: Payer: 59

## 2022-07-21 ENCOUNTER — Ambulatory Visit: Payer: 59 | Admitting: Hematology and Oncology

## 2022-08-03 ENCOUNTER — Ambulatory Visit (HOSPITAL_COMMUNITY)
Admission: RE | Admit: 2022-08-03 | Discharge: 2022-08-03 | Disposition: A | Discharge status: Home / Self Care | Payer: 59 | Source: Ambulatory Visit | Attending: Gastroenterology | Admitting: Gastroenterology

## 2022-08-03 ENCOUNTER — Other Ambulatory Visit: Payer: Self-pay | Admitting: Gastroenterology

## 2022-08-03 ENCOUNTER — Other Ambulatory Visit: Payer: Self-pay

## 2022-08-03 DIAGNOSIS — K7031 Alcoholic cirrhosis of liver with ascites: Secondary | ICD-10-CM

## 2022-08-03 MED ORDER — GADOBUTROL 1 MMOL/ML IV SOLN
8.0000 mL | Freq: Once | INTRAVENOUS | Status: AC | PRN
  Administered 2022-08-03: 8 mL via INTRAVENOUS

## 2022-08-26 ENCOUNTER — Encounter: Payer: Self-pay | Admitting: Gastroenterology

## 2022-08-26 ENCOUNTER — Ambulatory Visit (INDEPENDENT_AMBULATORY_CARE_PROVIDER_SITE_OTHER): Payer: 59 | Admitting: Gastroenterology

## 2022-08-26 VITALS — BP 110/68 | HR 55 | Ht 65.0 in | Wt 182.4 lb

## 2022-08-26 DIAGNOSIS — D649 Anemia, unspecified: Secondary | ICD-10-CM

## 2022-08-26 DIAGNOSIS — I8510 Secondary esophageal varices without bleeding: Secondary | ICD-10-CM

## 2022-08-26 DIAGNOSIS — K766 Portal hypertension: Secondary | ICD-10-CM | POA: Diagnosis not present

## 2022-08-26 DIAGNOSIS — K7031 Alcoholic cirrhosis of liver with ascites: Secondary | ICD-10-CM

## 2022-08-26 NOTE — Progress Notes (Signed)
Referring Provider: Mayer Masker, PA-C Primary Care Physician:  Mayer Masker, PA-C   Chief complaint:  liver disease   IMPRESSION:  Decompensated liver disease - likely underlying cirrhosis with hospitalization in May 2022 for concurrent alcohol-related hepatitis with ascites, hyponatremia, and coagulopathy. She initially stopped drinking but had a relapse around some relationship stress in December 2022. No alcohol since that time.  Symptoms of portal hypertension have stabilized. Formal alcohol counseling recommended and she is currently considering going with a friend.   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 Hopefully with sustained abstinence the varices will resolve.  Splenomegaly and thrombocytopenia. Related to portal hypertension.   Normocytic anemia without overt bleeding. Repeat labs today. May be related to heavy menses.  Heavy menses. Now with increased concerns about bleeding. Will refer to GYN.  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   PLAN: - Continue abstinence from alcohol, formal counseling recommended - Discussed diet recommendations in the setting of cirrhosis - Labs today including CMP, CBC, INR, AFP to calculate a MELD score - Ferritin - Abdominal ultrasound 2/24 for HCC screening - Follow-up in 6 months, earlier if needed - Referral to GYN    HPI: Brandi James is a 41 y.o. female returns in follow-up.  She was last seen in the office December 2022 for alcohol-related hepatitis. She returns today in scheduled follow-up. She is doing well. She has no specific complaints or concerns at this time except for ear throbbing. GI ROS is negative.     She is feeling the best she has. She has been busy kayaking this summer. No specific complaints or concerns at this time except for heavy menses.   Labs  01/12/2022 show an AFP of 3.3, INR 1.5 Labs 04/06/2022: Sodium 137, creatinine 0.63, total bilirubin 0.7, ALT 32, albumin 4.0, hemoglobin 10.9, platelets 97 Iron studies 04/06/2022 show a ferritin of 48, iron 37  MRI 08/03/22:  1. Cirrhosis. Diffusely patchy liver parenchymal signal intensity and enhancement, favor confluent hepatic fibrosis. No discrete liver masses. Suggest correlation with serum AFP and follow-up screening MRI abdomen without and with IV contrast in 6 months. 2. Mild splenomegaly. No ascites. 3. Mild lower esophageal and posterior perihepatic varices. 4. Bile ducts are within normal post cholecystectomy limits.  Cirrhosis Maintenance Jaundice: none Ascites: none Bleeding: none Confusion: none HPS: none HRS: none Meds: none Alcohol Use: December 2022 2 gm Na Diet: no special diets Last [facility] Screen: MRI 08/03/22  Last EGD for Varices Surveillance: 06/13/21 showed portal hypertensive gastropathy, no varices Last Colonoscopy: Not due until age 45 Recent Meld-Na Score: awaiting labs to calculate   Past Medical History:  Diagnosis Date   Depression with anxiety    Hypertension     01/20/2021   Liver disease    Panic attacks    Substance abuse (HCC)    01/20/2021    Past Surgical History:  Procedure Laterality Date   IR PARACENTESIS  05/08/2021   LAPAROSCOPIC CHOLECYSTECTOMY  2009   LAPAROSCOPIC GASTRIC BYPASS N/A 2003   [facility].  Dr Adele Dan   PANNICULECTOMY  2004   To address excessive skin following weight loss     Current Outpatient Medications  Medication Sig Dispense Refill   escitalopram (LEXAPRO) 5 MG tablet Take 1 tablet (5 mg total) by mouth daily. 90 tablet 1   gabapentin (NEURONTIN) 100 MG capsule Take 1 capsule (100 mg total) by  mouth 3 (three) times daily. 270 capsule 1   hydrOXYzine (ATARAX) 25 MG tablet Take 1 tablet (25 mg total) by mouth every 8 (eight) hours as needed for anxiety. 60 tablet 0   medroxyPROGESTERone (DEPO-PROVERA) 150  MG/ML injection Inject 1 mL (150 mg total) into the muscle every 3 (three) months. 1 mL 3   spironolactone (ALDACTONE) 50 MG tablet Take 1 tablet (50 mg total) by mouth daily. 30 tablet 5   No current facility-administered medications for this visit.    Allergies as of 08/26/2022 - Review Complete 08/26/2022  Allergen Reaction Noted   Nsaids Other (See Comments) 03/24/2021   Morphine and related Other (See Comments) 09/08/2017    Family History  Problem Relation Age of Onset   Healthy Mother    Diabetes Father    Hypertension Father    Colon cancer Neg Hx    Rectal cancer Neg Hx    Stomach cancer Neg Hx    Esophageal cancer Neg Hx       Physical Exam: General:   Alert, in NAD. No scleral icterus. No bilateral temporal wasting.  Heart:  Regular rate and rhythm; no murmurs Pulm: Clear anteriorly; no wheezing Abdomen:  Soft. Nontender. Nondistended. Normal bowel sounds. No rebound or guarding. No fluid wave.  LAD: No inguinal or umbilical LAD Extremities:  Without edema. Neurologic:  Alert and  oriented x4;  grossly normal neurologically; no asterixis or clonus. Skin: No jaundice. Mild palmar erythema. No spider angioma.  Psych:  Alert and cooperative. Normal mood and affect.     Nidia Grogan L. Orvan Falconer, MD, MPH 08/26/2022, 2:30 PM

## 2022-08-26 NOTE — Patient Instructions (Addendum)
It was my pleasure to provide care to you today. Based on our discussion, I am providing you with my recommendations below:  RECOMMENDATION(S):   Congratulations on no alcohol. Over the next 12-24 months you should continue to see improvements if you abstain from alcohol.  You should have an imaging study every 6 months to monitor for the development of hepatocellular carcinoma (liver cancer). The risk is low, but, if liver cancer is diagnosed early, there are better treatment options.   I recommend a high-protein, primarily plant-based diet.  Avoid red meat.  No raw or undercooked meat, seafood, or shellfish.  Work to maintain a health weight. Minimize salt intake.  Please do not consume more than 2000 mg of sodium every day.  I recommend that you take a multivitamin ever yday.  Stay active. Weight-based exercise for 30 minutes at least 3 days a week is recommended.  I recommend that you not drink any alcohol including beer, wine, liquor, and non-alcoholic beer. Formal counseling recommended.   You are at increased risk of osteopenia and osteoporosis. You should be screened for these metabolic bone diseases if you have not already had the testing performed.   The [employer] and UpToDate are good websites for information about your liver.    I would like for you to follow-up with a GYN given your heavy periods.    FOLLOW UP:  I would like for you to follow up with me in 6 months. Please call the office at [phone] to schedule your appointment.  BMI:  If you are age 43 or older, your body mass index should be between 23-30. Your Body mass index is 30.35 kg/m. If this is out of the aforementioned range listed, please consider follow up with your Primary Care Provider.  If you are age 26 or younger, your body mass index should be between 19-25. Your Body mass index is 30.35 kg/m. If this is out of the aformentioned range listed, please consider follow up with your  Primary Care Provider.   MY CHART:  The [facility] providers would like to encourage you to use [facility] to communicate with providers for non-urgent requests or questions.  Due to long hold times on the telephone, sending your provider a message by [facility] may be a faster and more efficient way to get a response.  Please allow 48 business hours for a response.  Please remember that this is for non-urgent requests.   Thank you for trusting me with your gastrointestinal care!    Tressia Danas, MD, MPH

## 2022-09-18 ENCOUNTER — Other Ambulatory Visit: Payer: Self-pay | Admitting: Physician Assistant

## 2022-09-18 DIAGNOSIS — F411 Generalized anxiety disorder: Secondary | ICD-10-CM

## 2022-10-10 IMAGING — US IR PARACENTESIS
1 series · 4 of 4 positions shown · non-contrast
Comparison: none

INDICATION: Patient with history of alcohol abuse, newly diagnosed cirrhosis who
presented to the ED with complaints of worsening abdominal
distension and jaundice. Request to IR for diagnostic and
therapeutic paracentesis.

[Series 1: ir (id) (id)/(id)/(id) ir · 4 of 4 slices shown]
[im 1/4]
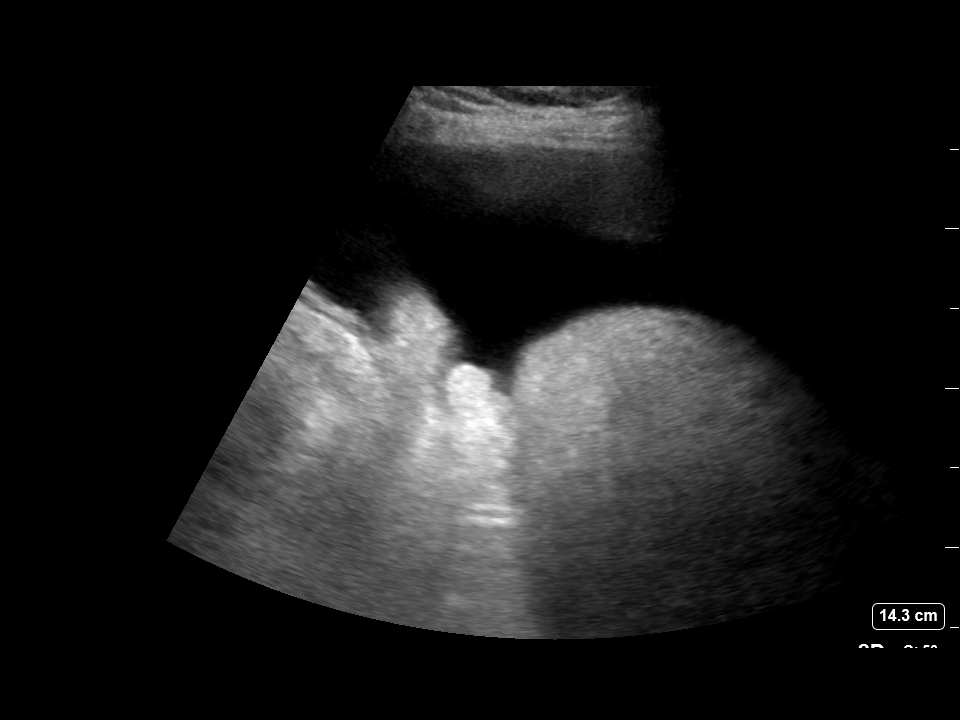
[im 2/4]
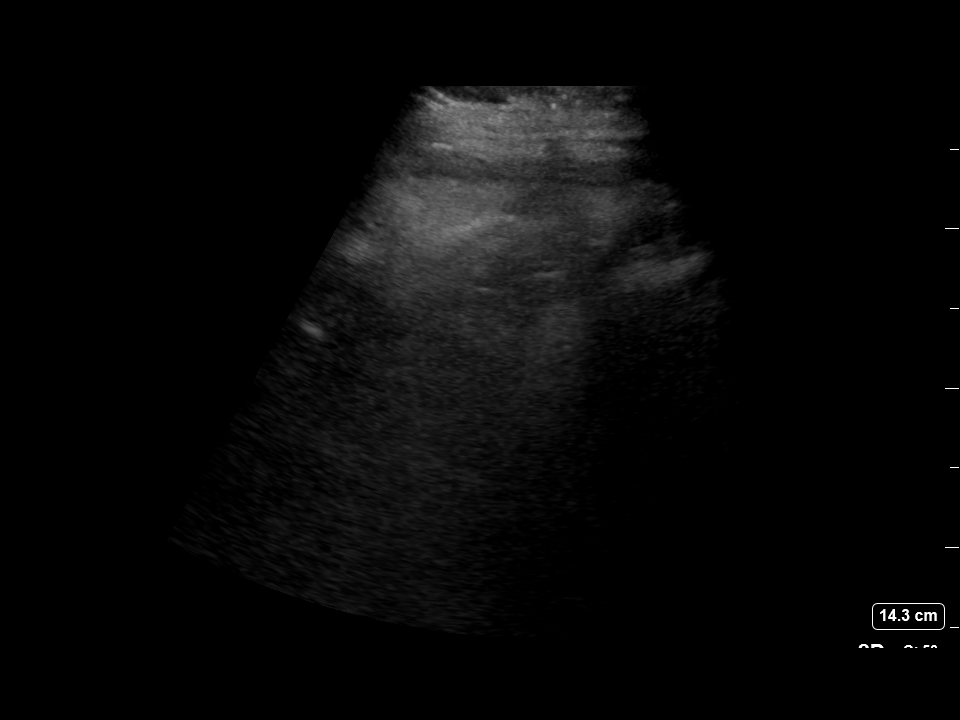
[im 3/4]
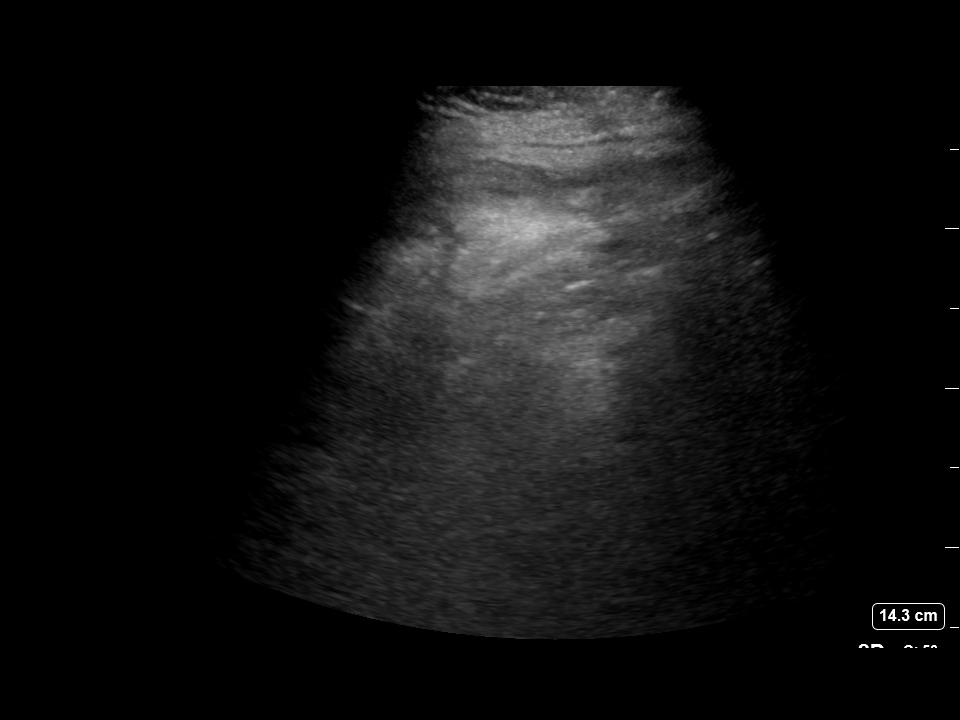
[im 4/4]
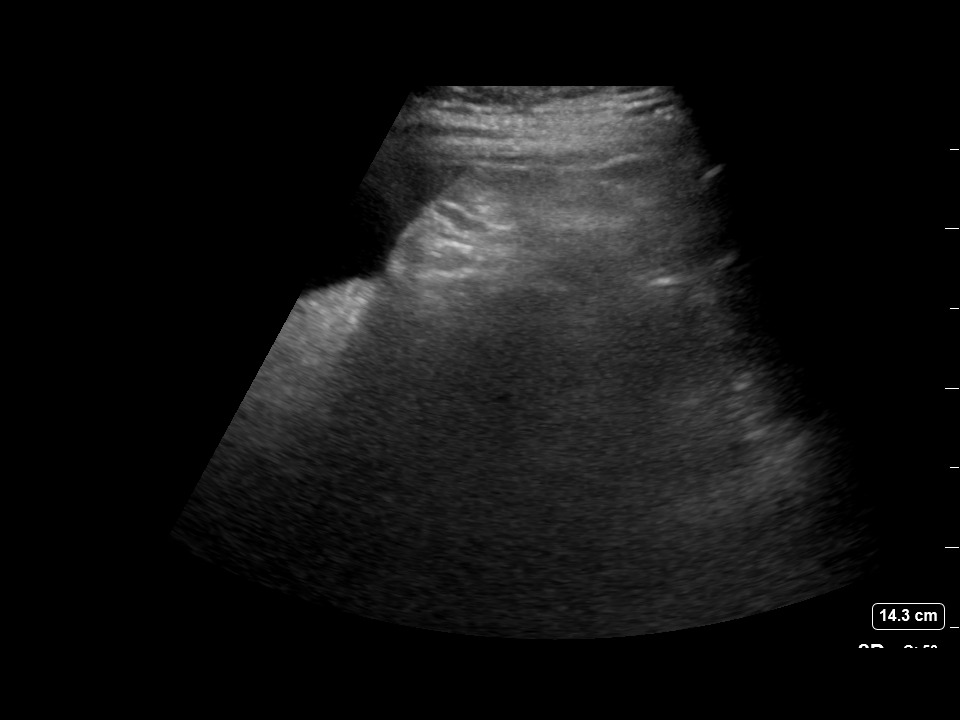

[4 of 4 positions shown; findings below may reference images not displayed]

EXAM:
ULTRASOUND GUIDED DIAGNOSTIC AND THERAPEUTIC PARACENTESIS

MEDICATIONS:
10 mL 1% lidocaine

COMPLICATIONS:
None immediate.

PROCEDURE:
Informed written consent was obtained from the patient after a
discussion of the risks, benefits and alternatives to treatment. A
timeout was performed prior to the initiation of the procedure.

Initial ultrasound scanning demonstrates a small amount of ascites
within the right upper abdominal quadrant. The right upper abdomen
was prepped and draped in the usual sterile fashion. 1% lidocaine
was used for local anesthesia.

Following this, a 19 gauge, 7-cm, Yueh catheter was introduced. An
ultrasound image was saved for documentation purposes. The
paracentesis was performed. The catheter was removed and a dressing
was applied. The patient tolerated the procedure well without
immediate post procedural complication.
FINDINGS: A total of approximately 1.4 L of clear yellow fluid was removed.
Samples were sent to the laboratory as requested by the clinical
team.
IMPRESSION: Successful ultrasound-guided paracentesis yielding 1.4 liters of
peritoneal fluid.

Read by Moolman, Klpigbb

## 2022-10-10 IMAGING — US US ABDOMEN LIMITED
1 series · 14 of 25 positions shown · non-contrast
Comparison: MRI abdomen 04/08/2008.

CLINICAL DATA: 40-year-old female with cirrhosis.  Ascites.

EXAM:
ULTRASOUND ABDOMEN LIMITED RIGHT UPPER QUADRANT

[Series 1: us abdomen limited ruq (liver/gb) · 14 of 42 slices shown]
[im 1/42]
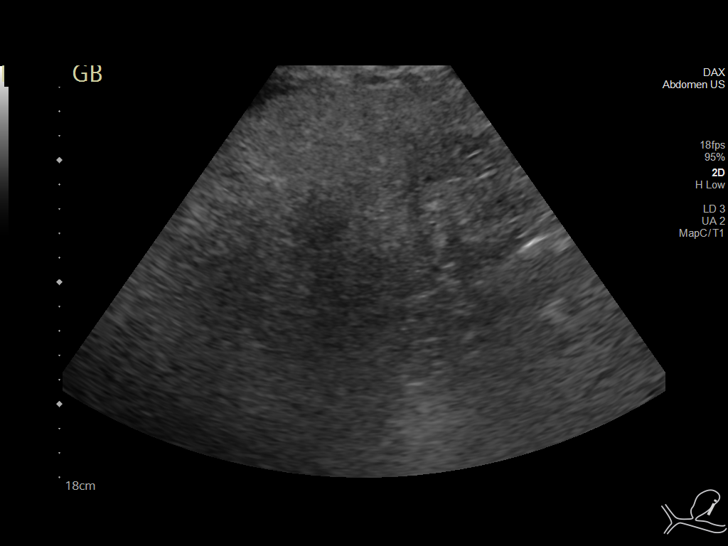
[im 4/42]
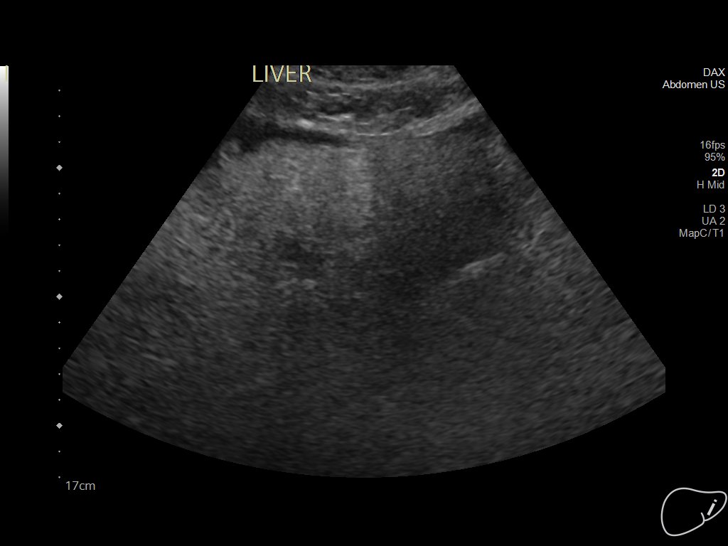
[im 7/42]
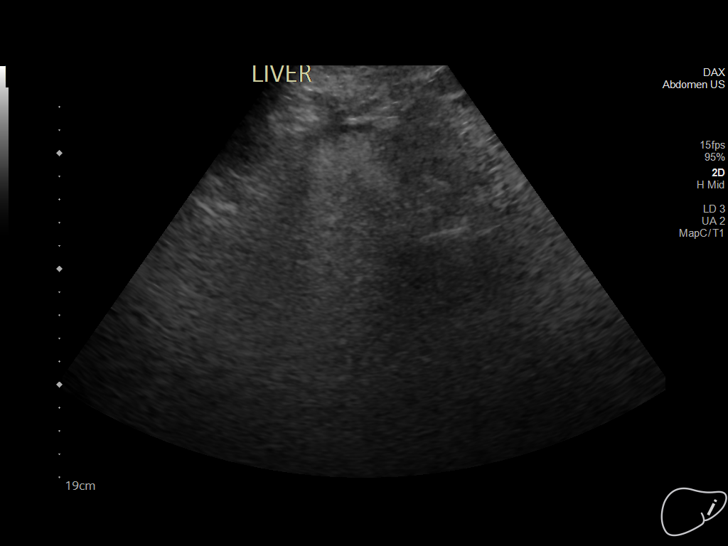
[im 11/42]
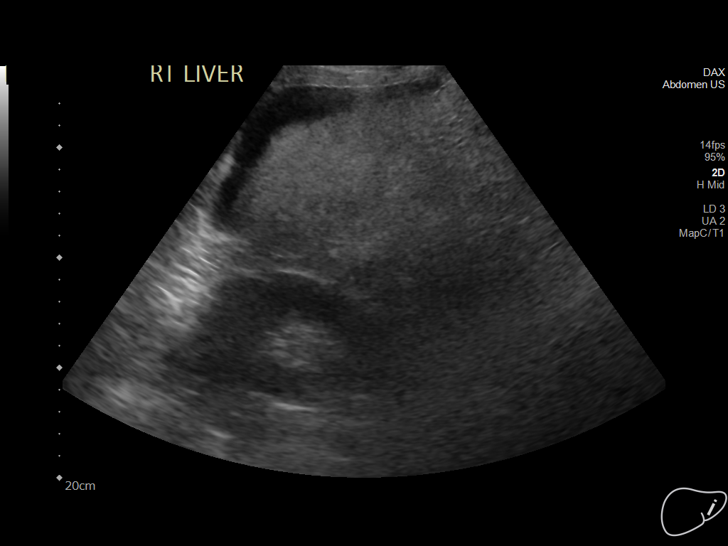
[im 14/42]
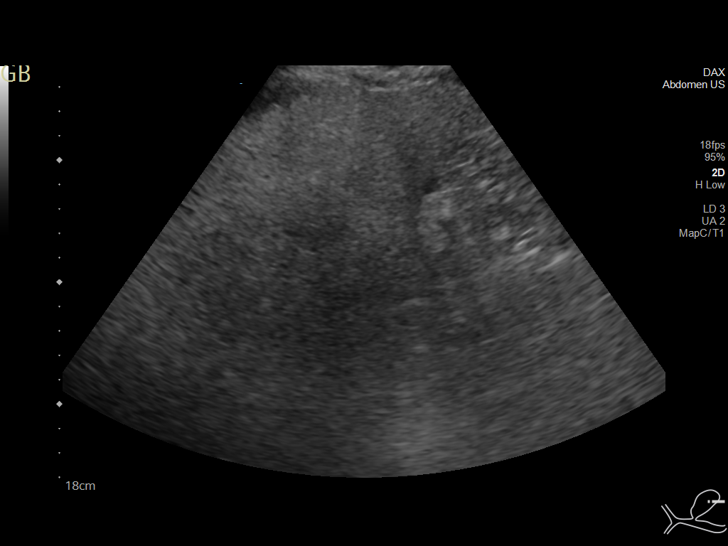
[im 16/42]
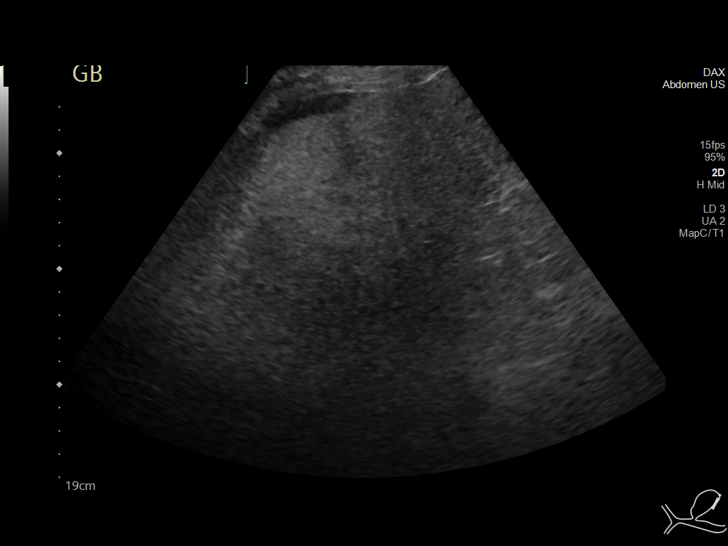
[im 19/42]
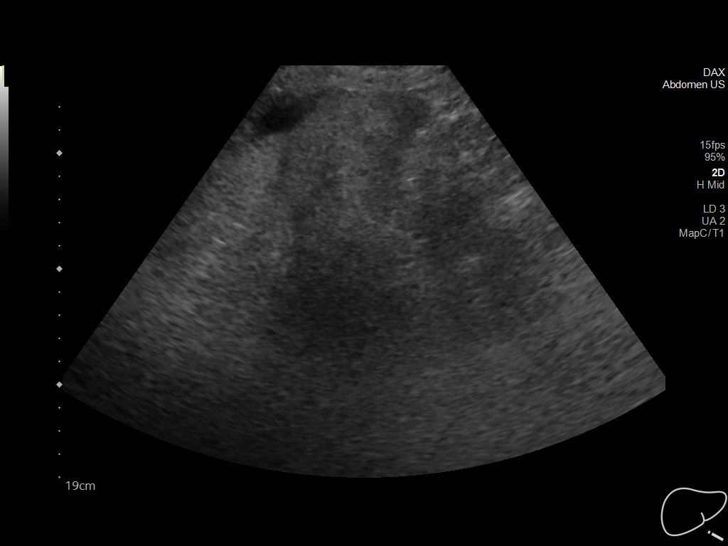
[im 23/42]
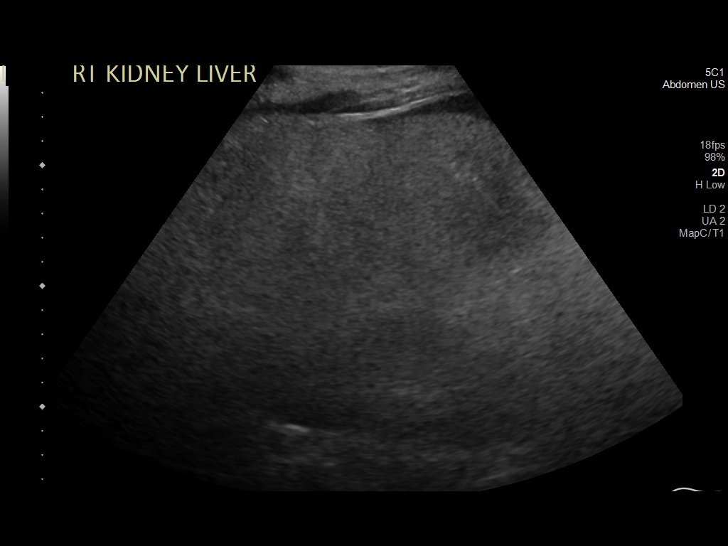
[im 26/42]
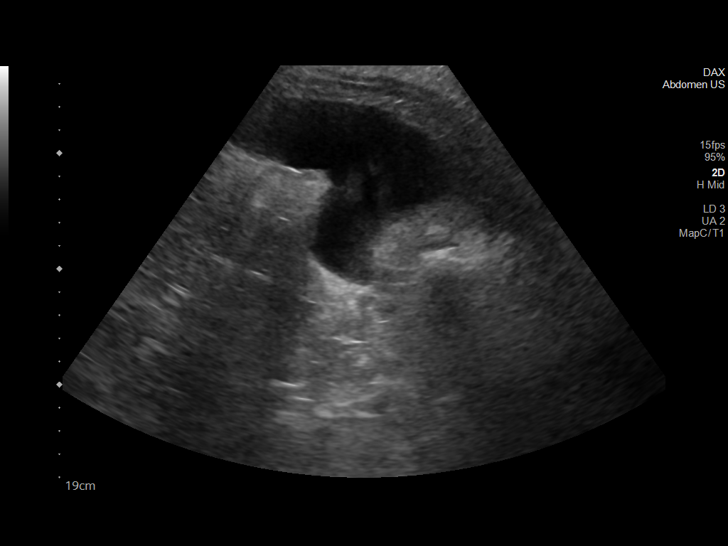
[im 28/42]
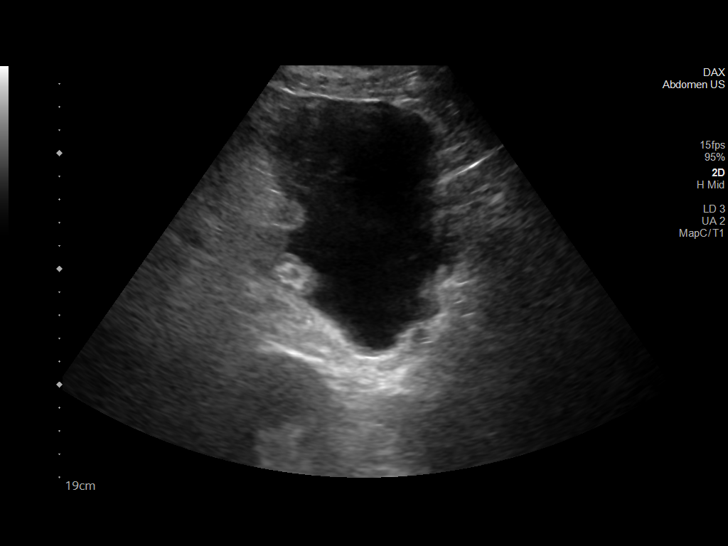
[im 31/42]
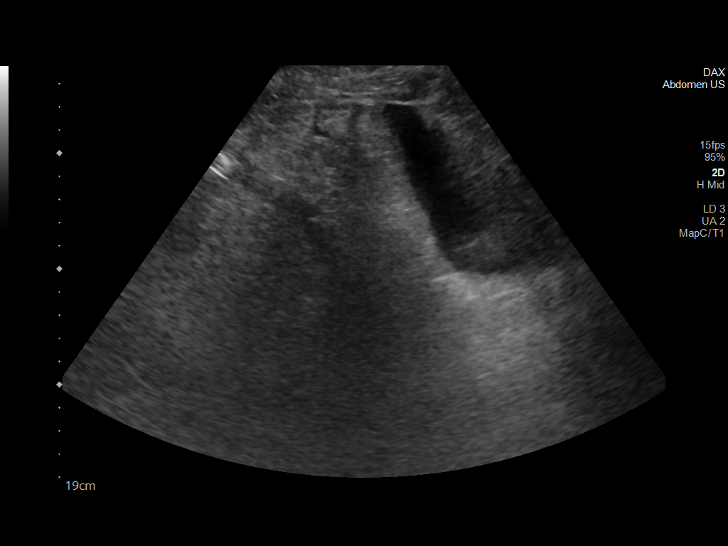
[im 35/42]
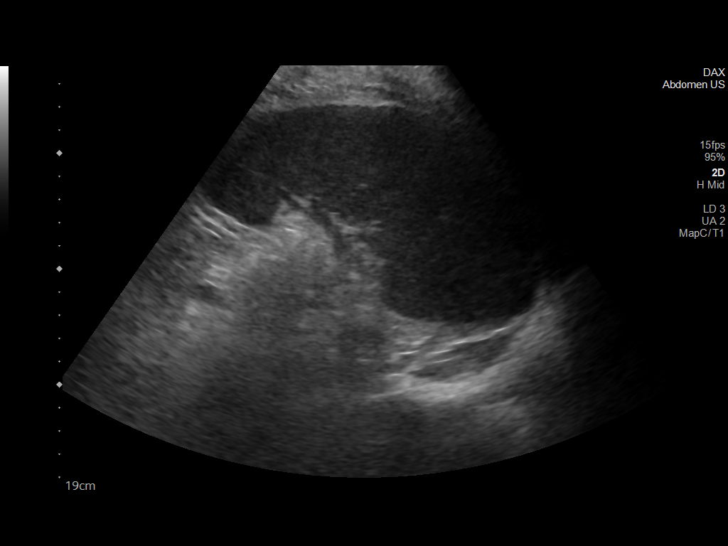
[im 38/42]
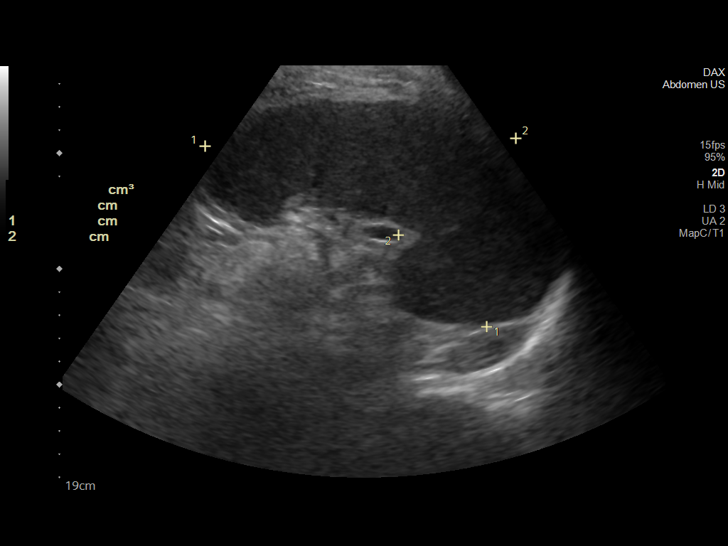
[im 42/42]
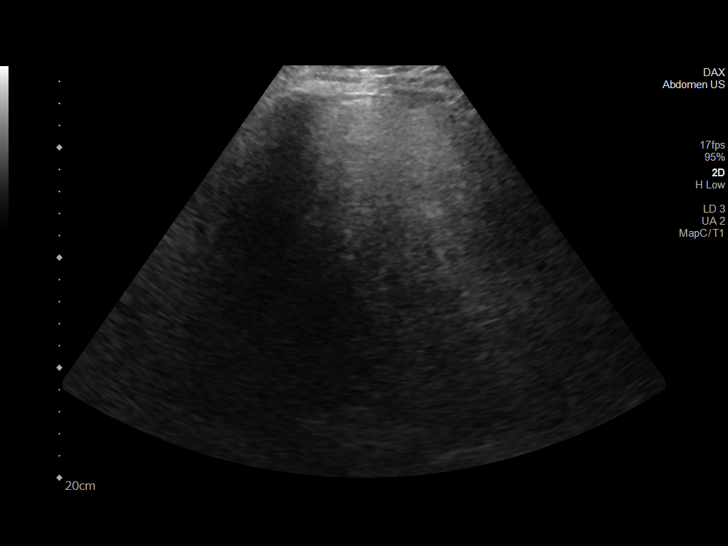

[14 of 25 positions shown; findings below may reference images not displayed]

FINDINGS: Gallbladder:

Reportedly surgically absent.

Common bile duct:

Diameter: Could not be visualized.

Liver:

Echogenic liver (image 12) with nodular liver contour. No discrete
liver lesion. No intrahepatic biliary ductal dilatation. Portal vein
could not be visualized.

Other: Negative visible right kidney. Small volume perihepatic
ascites (images 12, 23). Grayscale ultrasound images were also
obtained in both gutters and lower quadrants. Small volume of
ascites is present overall, primarily pooling in the right lower
quadrant (image 30). Splenomegaly (length approximately 17 cm.
IMPRESSION: 1. Cirrhosis with evidence of fatty liver disease. No discrete liver
lesion. Portal vein and common bile duct could not be visualized by
ultrasound.
2. Small volume of ascites overall, most abundant in the right lower
quadrant.

## 2022-10-20 ENCOUNTER — Ambulatory Visit: Payer: 59

## 2022-10-21 ENCOUNTER — Ambulatory Visit (INDEPENDENT_AMBULATORY_CARE_PROVIDER_SITE_OTHER): Payer: 59 | Admitting: Physician Assistant

## 2022-10-21 VITALS — BP 130/84 | HR 60 | Ht 65.0 in | Wt 180.4 lb

## 2022-10-21 DIAGNOSIS — Z3042 Encounter for surveillance of injectable contraceptive: Secondary | ICD-10-CM | POA: Diagnosis not present

## 2022-10-21 LAB — POCT URINE PREGNANCY: Preg Test, Ur: NEGATIVE

## 2022-10-21 MED ORDER — MEDROXYPROGESTERONE ACETATE 150 MG/ML IM SUSP
150.0000 mg | Freq: Once | INTRAMUSCULAR | Status: AC
  Administered 2022-10-21: 150 mg via INTRAMUSCULAR

## 2022-10-21 NOTE — Progress Notes (Signed)
Patient tolerated well. Declined 15 min wait for eval.  

## 2022-10-22 ENCOUNTER — Ambulatory Visit: Payer: 59

## 2022-12-30 ENCOUNTER — Ambulatory Visit: Payer: 59 | Admitting: Nurse Practitioner

## 2023-01-14 ENCOUNTER — Ambulatory Visit: Payer: 59

## 2023-01-19 ENCOUNTER — Ambulatory Visit (INDEPENDENT_AMBULATORY_CARE_PROVIDER_SITE_OTHER): Payer: 59 | Admitting: Nurse Practitioner

## 2023-01-19 VITALS — BP 120/77 | HR 67 | Resp 20 | Ht 64.96 in | Wt 183.0 lb

## 2023-01-19 DIAGNOSIS — Z3042 Encounter for surveillance of injectable contraceptive: Secondary | ICD-10-CM | POA: Diagnosis not present

## 2023-01-19 MED ORDER — MEDROXYPROGESTERONE ACETATE 150 MG/ML IM SUSP
150.0000 mg | INTRAMUSCULAR | Status: DC
  Administered 2023-01-19: 150 mg via INTRAMUSCULAR

## 2023-01-19 NOTE — Progress Notes (Signed)
Depo-Provera given in Left Ventrogluteal. Next injection due April 17- May 1. Patient provided a calendar with highlighted window.

## 2023-02-04 ENCOUNTER — Other Ambulatory Visit: Payer: Self-pay

## 2023-02-04 ENCOUNTER — Telehealth: Payer: Self-pay

## 2023-02-04 DIAGNOSIS — K7031 Alcoholic cirrhosis of liver with ascites: Secondary | ICD-10-CM

## 2023-02-04 NOTE — Telephone Encounter (Signed)
Order placed and pt contacted through mychart. Message sent to radiology scheduling.

## 2023-02-04 NOTE — Telephone Encounter (Signed)
-----   Message from Algernon Huxley, RN sent at 02/03/2023  2:01 PM EST ----- Brandi James pt ----- Message ----- From: Carl Best, RN Sent: 02/03/2023   8:00 AM EST To: Carl Best, RN  Patient needs 6 month f/u MRI for [facility] screening. Refer to result note 8/14.

## 2023-03-10 DIAGNOSIS — M5382 Other specified dorsopathies, cervical region: Secondary | ICD-10-CM | POA: Diagnosis not present

## 2023-03-10 DIAGNOSIS — M9901 Segmental and somatic dysfunction of cervical region: Secondary | ICD-10-CM | POA: Diagnosis not present

## 2023-03-15 DIAGNOSIS — M5382 Other specified dorsopathies, cervical region: Secondary | ICD-10-CM | POA: Diagnosis not present

## 2023-03-15 DIAGNOSIS — M9901 Segmental and somatic dysfunction of cervical region: Secondary | ICD-10-CM | POA: Diagnosis not present

## 2023-03-16 DIAGNOSIS — M5382 Other specified dorsopathies, cervical region: Secondary | ICD-10-CM | POA: Diagnosis not present

## 2023-03-16 DIAGNOSIS — M9901 Segmental and somatic dysfunction of cervical region: Secondary | ICD-10-CM | POA: Diagnosis not present

## 2023-03-18 DIAGNOSIS — M5382 Other specified dorsopathies, cervical region: Secondary | ICD-10-CM | POA: Diagnosis not present

## 2023-03-18 DIAGNOSIS — M9901 Segmental and somatic dysfunction of cervical region: Secondary | ICD-10-CM | POA: Diagnosis not present

## 2023-03-24 DIAGNOSIS — M5382 Other specified dorsopathies, cervical region: Secondary | ICD-10-CM | POA: Diagnosis not present

## 2023-03-24 DIAGNOSIS — M9901 Segmental and somatic dysfunction of cervical region: Secondary | ICD-10-CM | POA: Diagnosis not present

## 2023-03-29 DIAGNOSIS — M9901 Segmental and somatic dysfunction of cervical region: Secondary | ICD-10-CM | POA: Diagnosis not present

## 2023-03-29 DIAGNOSIS — M5382 Other specified dorsopathies, cervical region: Secondary | ICD-10-CM | POA: Diagnosis not present

## 2023-04-01 ENCOUNTER — Other Ambulatory Visit: Payer: Self-pay | Admitting: Nurse Practitioner

## 2023-04-01 DIAGNOSIS — G629 Polyneuropathy, unspecified: Secondary | ICD-10-CM

## 2023-04-03 IMAGING — US US ABDOMEN COMPLETE
1 series · 14 of 25 positions shown · non-contrast
Comparison: Ultrasound 05/08/2021

CLINICAL DATA: Assess for HCC

EXAM:
ABDOMEN ULTRASOUND COMPLETE

[Series 1: us abdomen complete · 14 of 125 slices shown]
[im 1/125]
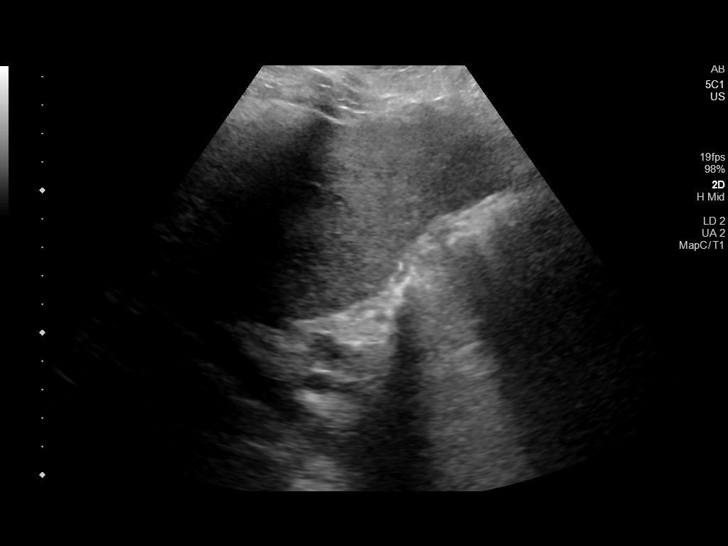
[im 11/125]
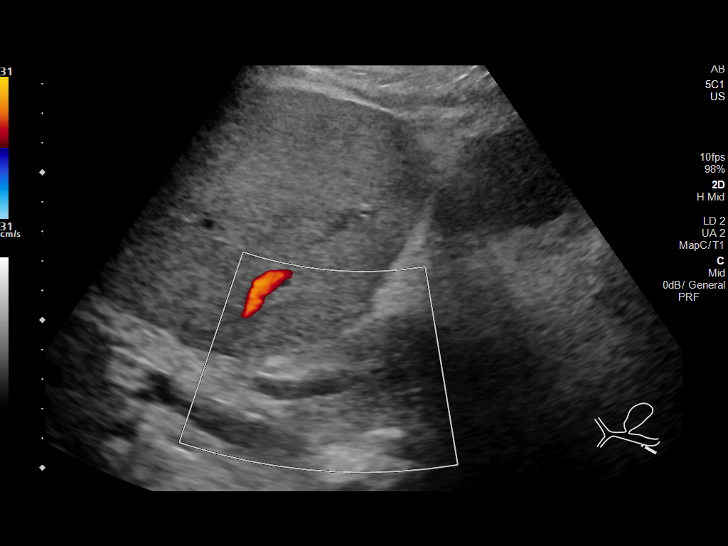
[im 21/125]
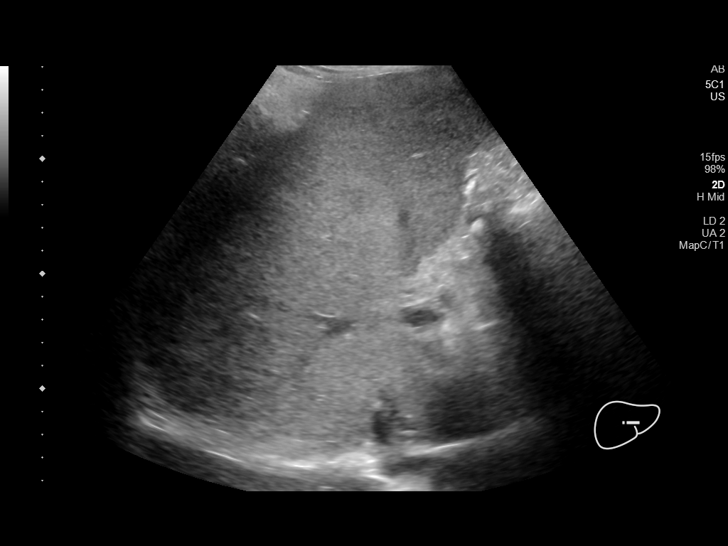
[im 32/125]
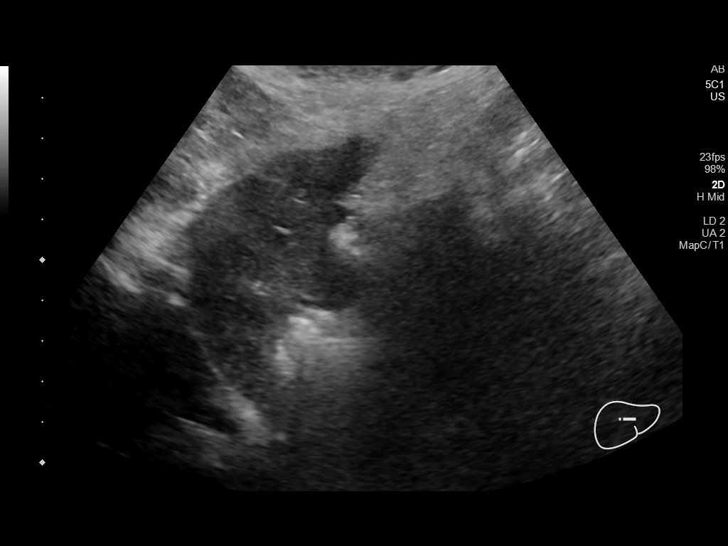
[im 42/125]
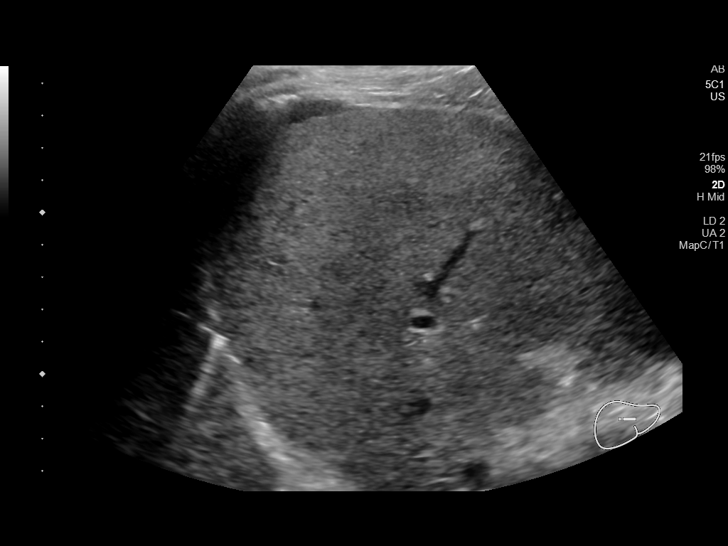
[im 47/125]
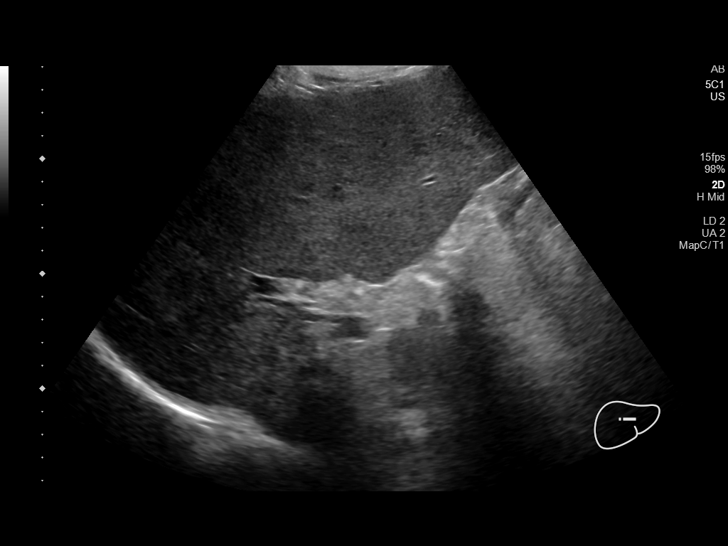
[im 57/125]
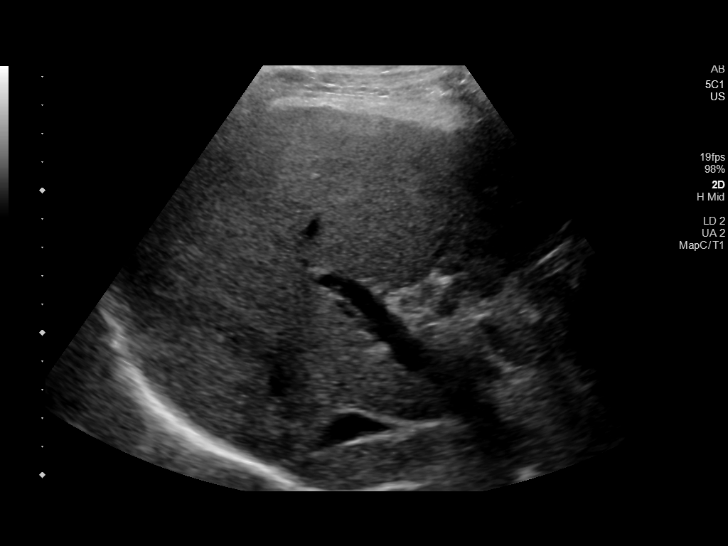
[im 68/125]
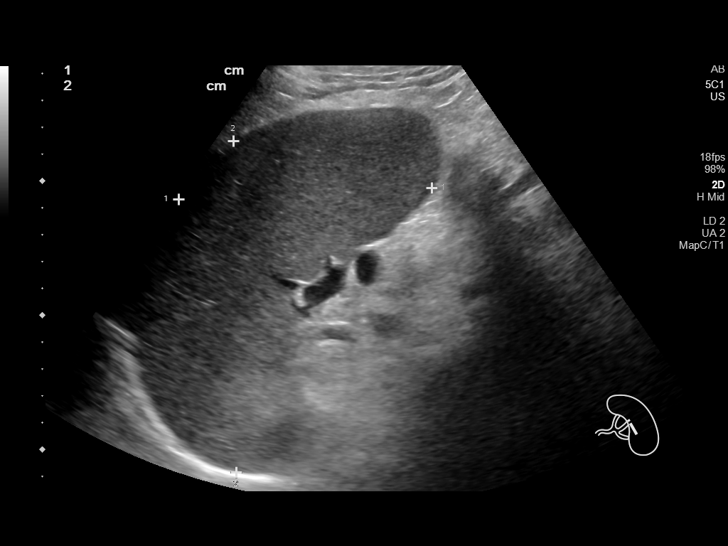
[im 78/125]
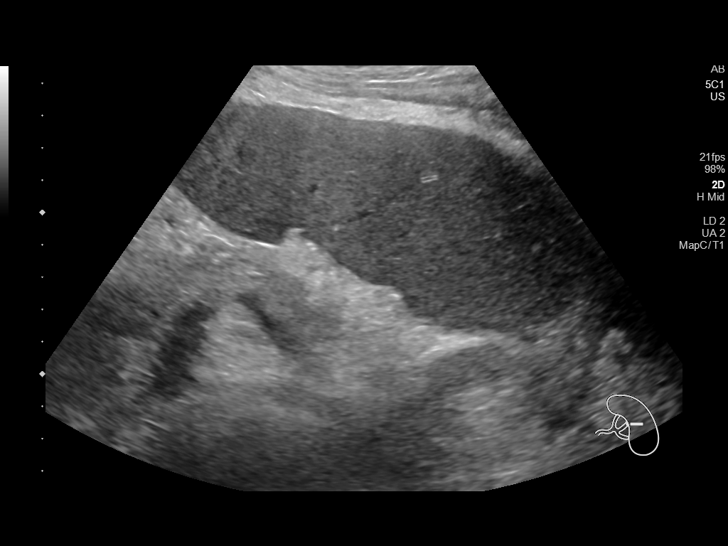
[im 83/125]
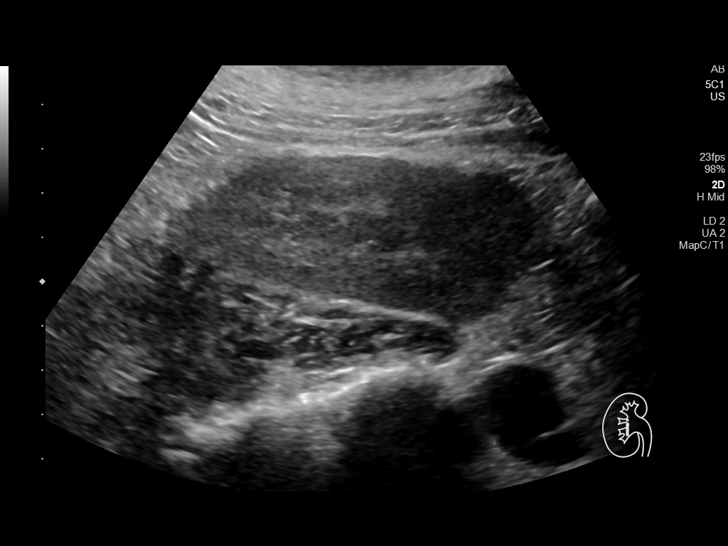
[im 94/125]
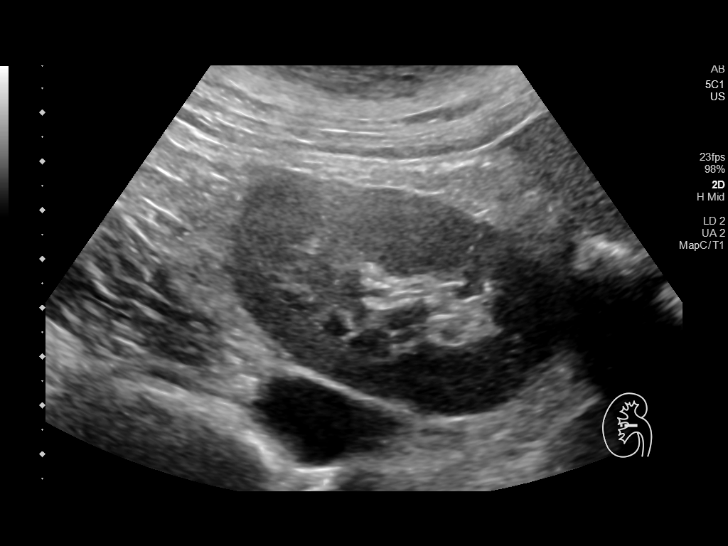
[im 104/125]
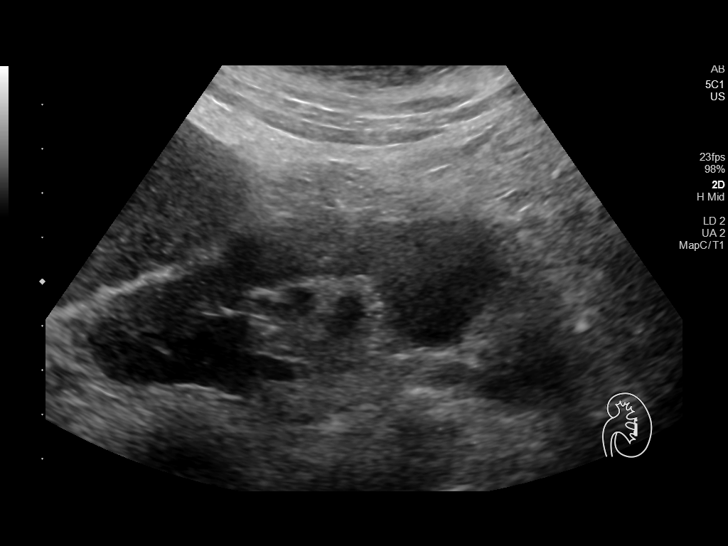
[im 114/125]
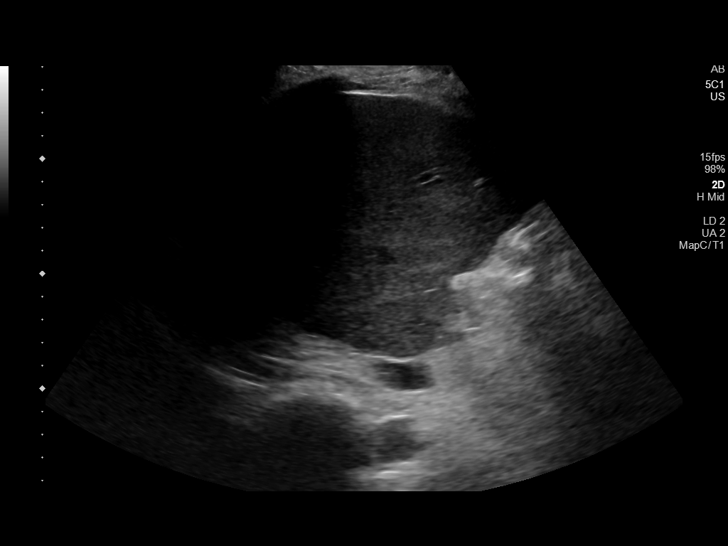
[im 125/125]
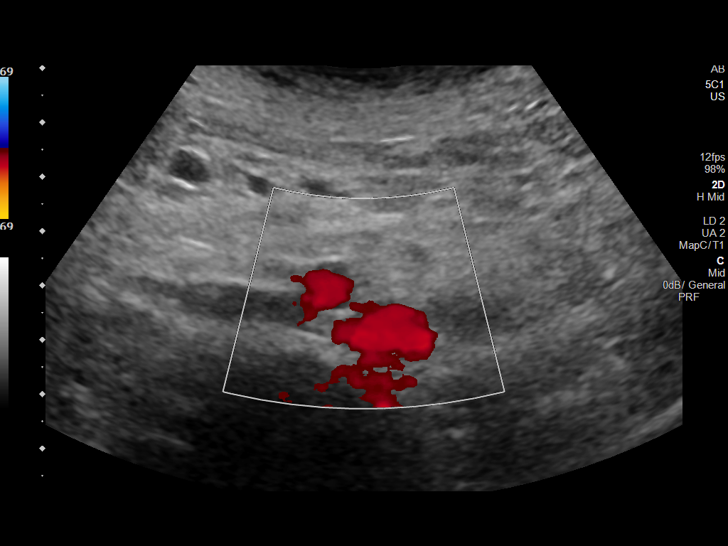

[14 of 25 positions shown; findings below may reference images not displayed]

FINDINGS: Gallbladder: Surgically absent

Common bile duct: Diameter: 5.6 mm

Liver: Cirrhotic morphology of the liver. No focal hepatic
abnormality. Portal vein is patent on color Doppler imaging with
normal direction of blood flow towards the liver.

IVC: No abnormality visualized.

Pancreas: Visualized portion unremarkable.

Spleen: Borderline enlarged

Right Kidney: Length: 12 cm. Echogenicity within normal limits. No
mass or hydronephrosis visualized.

Left Kidney: Length: 12.2 cm. Echogenicity within normal limits. No
mass or hydronephrosis visualized.

Abdominal aorta: No aneurysm visualized.

Other findings: Trace perihepatic ascites
IMPRESSION: 1. Liver cirrhosis without sonographic evidence for hepatic mass
lesion
2. Trace perihepatic ascites
3. Borderline splenomegaly

## 2023-04-06 ENCOUNTER — Encounter: Payer: Self-pay | Admitting: Family Medicine

## 2023-04-06 ENCOUNTER — Ambulatory Visit (INDEPENDENT_AMBULATORY_CARE_PROVIDER_SITE_OTHER): Payer: 59 | Admitting: Family Medicine

## 2023-04-06 VITALS — BP 129/84 | HR 70 | Ht 64.0 in | Wt 185.0 lb

## 2023-04-06 DIAGNOSIS — F322 Major depressive disorder, single episode, severe without psychotic features: Secondary | ICD-10-CM

## 2023-04-06 DIAGNOSIS — I10 Essential (primary) hypertension: Secondary | ICD-10-CM | POA: Diagnosis not present

## 2023-04-06 DIAGNOSIS — N920 Excessive and frequent menstruation with regular cycle: Secondary | ICD-10-CM

## 2023-04-06 DIAGNOSIS — F411 Generalized anxiety disorder: Secondary | ICD-10-CM | POA: Diagnosis not present

## 2023-04-06 DIAGNOSIS — G629 Polyneuropathy, unspecified: Secondary | ICD-10-CM | POA: Diagnosis not present

## 2023-04-06 MED ORDER — SPIRONOLACTONE 50 MG PO TABS: 50.0000 mg | ORAL_TABLET | Freq: Every day | ORAL | 1 refills | Status: AC

## 2023-04-06 MED ORDER — GABAPENTIN 100 MG PO CAPS: 100.0000 mg | ORAL_CAPSULE | Freq: Three times a day (TID) | ORAL | 1 refills | Status: DC

## 2023-04-06 MED ORDER — MEDROXYPROGESTERONE ACETATE 150 MG/ML IM SUSP: 150.0000 mg | INTRAMUSCULAR | 3 refills | Status: DC

## 2023-04-06 MED ORDER — ESCITALOPRAM OXALATE 10 MG PO TABS: 10.0000 mg | ORAL_TABLET | Freq: Every day | ORAL | 2 refills | Status: DC

## 2023-04-06 NOTE — Progress Notes (Unsigned)
   Established Patient Office Visit  Subjective   Patient ID: Brandi James, female    DOB: April 14, 1981  Age: 42 y.o. MRN: [id number]  Chief Complaint  Patient presents with   Depression   Hypertension   Peripheral Neuropathy    Neuropathy-patient continues to have burning/tingling in her lower extremities.  Has been improved since she quit alcohol.  Able to treat it with gabapentin 100 mg 3 times daily.  Depression-patient feeling more stressed out in the past 6 months since having a break-up with partner.  Is okay with increasing her dose of Lexapro to 10 mg.  Does feel like her mood becomes worse towards the end of her Depo shot.  Hypertension-has stopped taking spironolactone because her gastroenterologist, Dr. Orvan Falconer, is leaving the practice.  Discussed having me restart the spironolactone and patient was okay with this.  Cirrhosis-patient's gastroenterologist is leaving.  She states that they are setting her up with a new gastroenterologist in the practice.  She is that she has an appointment scheduled but not until the summer.  Patient has been having tingling/burning sensation in her right arm and a muscles spasm in her neck.  Worsens if she sleeps on it wrong.  Has been going to a chiropractor and this helps temporarily but pain returns.  Patient has not been using any alcohol or tobacco.     {History (Optional):23778}  ROS    Objective:     BP 129/84   Pulse 70   Ht  (1.626 m)   Wt 185 lb (83.9 kg)   SpO2 98% Comment: on RA  BMI 31.76 kg/m  {Vitals History (Optional):23777}  Physical Exam General: Alert oriented CV: Regular rate and rhythm Pulmonary: Clear bilaterally GI: Soft, nontender.   No results found for any visits on 04/06/23.  {Labs (Optional):23779}  The 10-year ASCVD risk score (Arnett DK, et al., 2019) is: 1.8%    Assessment & Plan:   Problem List Items Addressed This Visit       Cardiovascular and Mediastinum   HTN, goal below  130/80 - Primary    Refill patient spironolactone.      Relevant Medications   spironolactone (ALDACTONE) 50 MG tablet     Nervous and Auditory   Neuropathy    Refilled patient's gabapentin.      Relevant Medications   gabapentin (NEURONTIN) 100 MG capsule     Other   Generalized anxiety disorder   Relevant Medications   escitalopram (LEXAPRO) 10 MG tablet   Severe major depression without psychotic features    Increasing Lexapro to 10 mg.      Relevant Medications   escitalopram (LEXAPRO) 10 MG tablet   Other Visit Diagnoses     Menorrhagia with regular cycle       Relevant Medications   medroxyPROGESTERone (DEPO-PROVERA) 150 MG/ML injection       Return in about 3 months (around 07/06/2023) for HTN.    Sandre Kitty, MD

## 2023-04-06 NOTE — Assessment & Plan Note (Signed)
Increasing Lexapro to 10 mg.

## 2023-04-06 NOTE — Assessment & Plan Note (Signed)
Refill patient spironolactone.

## 2023-04-06 NOTE — Assessment & Plan Note (Signed)
Refilled patient's gabapentin.

## 2023-04-06 NOTE — Patient Instructions (Signed)
It was nice to meet you today,   I have refilled you prescriptions.    Come back in the next two weeks for your depo shot  Have a great day,   Dr. Constance Goltz

## 2023-04-07 ENCOUNTER — Ambulatory Visit (INDEPENDENT_AMBULATORY_CARE_PROVIDER_SITE_OTHER): Payer: 59 | Admitting: Family Medicine

## 2023-04-07 VITALS — Temp 97.9°F | Wt 187.0 lb

## 2023-04-07 DIAGNOSIS — Z3042 Encounter for surveillance of injectable contraceptive: Secondary | ICD-10-CM | POA: Diagnosis not present

## 2023-04-07 MED ORDER — MEDROXYPROGESTERONE ACETATE 150 MG/ML IM SUSY
150.0000 mg | PREFILLED_SYRINGE | Freq: Once | INTRAMUSCULAR | Status: AC
  Administered 2023-04-07: 150 mg via INTRAMUSCULAR

## 2023-04-07 MED ORDER — MEDROXYPROGESTERONE ACETATE 400 MG/ML IM SUSP: 150.0000 mg | Freq: Once | INTRAMUSCULAR | Status: DC

## 2023-04-07 NOTE — Patient Instructions (Signed)
Next Depo due July 3rd-July 17th.

## 2023-04-07 NOTE — Progress Notes (Signed)
Pt in office for depo shot. Given in RUQ, no problems.  Return for next between July 3rd-July 17th.

## 2023-07-07 ENCOUNTER — Ambulatory Visit (INDEPENDENT_AMBULATORY_CARE_PROVIDER_SITE_OTHER): Payer: 59 | Admitting: Family Medicine

## 2023-07-07 VITALS — BP 116/81 | HR 60 | Ht 64.0 in | Wt 193.4 lb

## 2023-07-07 DIAGNOSIS — N920 Excessive and frequent menstruation with regular cycle: Secondary | ICD-10-CM | POA: Diagnosis not present

## 2023-07-07 MED ORDER — MEDROXYPROGESTERONE ACETATE 150 MG/ML IM SUSP
150.0000 mg | Freq: Once | INTRAMUSCULAR | Status: AC
  Administered 2023-07-07: 150 mg via INTRAMUSCULAR

## 2023-07-07 NOTE — Progress Notes (Signed)
Patient is here for a Depo Provera injection . Denies chest pain SHOB, headaches, mood changes or problem with medication. Injection given in Right Ventrogluteal pt tolerated injection well w/o complication pt advised to schedule next injection 12 weeks

## 2023-07-12 ENCOUNTER — Ambulatory Visit: Payer: 59 | Admitting: Family Medicine

## 2023-07-12 NOTE — Progress Notes (Deleted)
   Established Patient Office Visit  Subjective   Patient ID: Brandi James, female    DOB: [birth year]  Age: 42 y.o. MRN: [id number]  No chief complaint on file.   HPI  Depression - increased to 10mg  lexapro.    HTN -   Anemia - still having issues with bleeding?   Cirrhosis - new Gastroenterologist?    The 10-year ASCVD risk score (Arnett DK, et al., 2019) is: 1.5%   {History (Optional):23778}  ROS    Objective:     There were no vitals taken for this visit. {Vitals History (Optional):23777}  Physical Exam   No results found for any visits on 07/12/23.  {Labs (Optional):23779}      Assessment & Plan:   There are no diagnoses linked to this encounter.   No follow-ups on file.    Sandre Kitty, MD

## 2023-09-23 ENCOUNTER — Ambulatory Visit: Payer: 59 | Admitting: Family Medicine

## 2023-09-23 DIAGNOSIS — Z3042 Encounter for surveillance of injectable contraceptive: Secondary | ICD-10-CM | POA: Diagnosis not present

## 2023-09-23 MED ORDER — MEDROXYPROGESTERONE ACETATE 150 MG/ML IM SUSP
150.0000 mg | INTRAMUSCULAR | Status: AC
Start: 2023-09-23 — End: ?
  Administered 2023-09-23: 150 mg via INTRAMUSCULAR

## 2023-09-23 NOTE — Progress Notes (Addendum)
After obtaining consent, and per orders of Dr. Constance Goltz, injection of Depo Provera given by Tonny Bollman. Patient instructed to remain in clinic for 20 minutes afterwards, and to report any adverse reaction to me immediately. Patient informed that next injection due 12/09/23 - 12/23/23.

## 2023-11-05 ENCOUNTER — Other Ambulatory Visit: Payer: Self-pay | Admitting: Family Medicine

## 2023-11-05 DIAGNOSIS — F411 Generalized anxiety disorder: Secondary | ICD-10-CM

## 2023-12-30 ENCOUNTER — Other Ambulatory Visit: Payer: Self-pay | Admitting: Family Medicine

## 2023-12-30 DIAGNOSIS — F411 Generalized anxiety disorder: Secondary | ICD-10-CM

## 2024-01-03 ENCOUNTER — Ambulatory Visit: Payer: 59 | Admitting: Family Medicine

## 2024-01-03 NOTE — Progress Notes (Deleted)
   Established Patient Office Visit  Subjective   Patient ID: Brandi James, female    DOB: 08/12/1981  Age: 43 y.o. MRN: [id number]  No chief complaint on file.   HPI  Never followed up in July.  Hypertension-on spironolactone .  Neuropathy-gabapentin .  Taking B vitamins?  Thiamine , folate, B12?  GAD-Lexapro   Cirrhosis-new gastroenterologist?   The ASCVD Risk score (Arnett DK, et al., 2019) failed to calculate for the following reasons:   Cannot find a previous HDL lab   Cannot find a previous total cholesterol lab  Health Maintenance Due  Topic Date Due   Pneumococcal Vaccine 38-68 Years old (2 of 2 - PCV) 06/11/2022   COVID-19 Vaccine (1 - 2024-25 season) Never done      Objective:     There were no vitals taken for this visit. {Vitals History (Optional):23777}  Physical Exam   No results found for any visits on 01/03/24.      Assessment & Plan:   There are no diagnoses linked to this encounter.   No follow-ups on file.    Toribio MARLA Slain, MD

## 2024-01-31 ENCOUNTER — Ambulatory Visit (INDEPENDENT_AMBULATORY_CARE_PROVIDER_SITE_OTHER): Payer: 59 | Admitting: Family Medicine

## 2024-01-31 ENCOUNTER — Encounter: Payer: Self-pay | Admitting: Family Medicine

## 2024-01-31 VITALS — BP 124/79 | HR 73 | Ht 64.0 in | Wt 211.0 lb

## 2024-01-31 DIAGNOSIS — Z3009 Encounter for other general counseling and advice on contraception: Secondary | ICD-10-CM

## 2024-01-31 DIAGNOSIS — M79673 Pain in unspecified foot: Secondary | ICD-10-CM | POA: Insufficient documentation

## 2024-01-31 DIAGNOSIS — F325 Major depressive disorder, single episode, in full remission: Secondary | ICD-10-CM

## 2024-01-31 DIAGNOSIS — F411 Generalized anxiety disorder: Secondary | ICD-10-CM | POA: Diagnosis not present

## 2024-01-31 DIAGNOSIS — G629 Polyneuropathy, unspecified: Secondary | ICD-10-CM

## 2024-01-31 DIAGNOSIS — K7031 Alcoholic cirrhosis of liver with ascites: Secondary | ICD-10-CM | POA: Diagnosis not present

## 2024-01-31 MED ORDER — HYDROXYZINE HCL 25 MG PO TABS: 25.0000 mg | ORAL_TABLET | Freq: Three times a day (TID) | ORAL | 0 refills | Status: AC | PRN

## 2024-01-31 NOTE — Progress Notes (Signed)
 Established Patient Office Visit  Subjective   Patient ID: Brandi James, female    DOB: Nov 30, 1981  Age: 43 y.o. MRN: [id number]  Chief Complaint  Patient presents with   Medical Management of Chronic Issues    HPI  Neuropathy-patient taking gabapentin  3 times daily.  No questions or concerns with this.  Cirrhosis-patient has not scheduled appointment with her new gastroenterologist yet.  She is not taking spironolactone  since her last visit.  Depression-patient feels like her symptoms are worse in the winter.  She is still taking Lexapro  10 mg.  Does not have any desire to change the dose at this time.  Birth control counseling-patient is due for her Depo-Provera  shot.  She is actually a month past due and has not had a menstrual period yet..  She has been on it for almost 2 years now.  We discussed holding off on taking it if the main reason was for birth control to see if she is still menstruating.  Patient complaining of pain in the right plantar foot that started suddenly 2-1/2 weeks ago.  She has already made a scheduled appointment with the podiatrist.  Has been using ice and doing stretching and exercises for it so far.   The ASCVD Risk score (Arnett DK, et al., 2019) failed to calculate for the following reasons:   Cannot find a previous HDL lab   Cannot find a previous total cholesterol lab  Health Maintenance Due  Topic Date Due   Pneumococcal Vaccine 10-53 Years old (2 of 2 - PCV) 06/11/2022   COVID-19 Vaccine (1 - 2024-25 season) Never done      Objective:     BP 124/79   Pulse 73   Ht 5\' 4"  (1.626 m)   Wt 211 lb (95.7 kg)   SpO2 97%   BMI 36.22 kg/m    Physical Exam General: Alert, oriented CV: Regular rate and rhythm Pulmonary: Skin bilaterally Extremities: No significant edema.   No results found for any visits on 01/31/24.      Assessment & Plan:   Alcoholic cirrhosis of liver with ascites [facility]) Assessment & Plan: Recommended patient  resume her spironolactone  and follow-up with her gastroenterologist.  Previous gastroenterologist no longer with the practice but patient is able to see a new provider at the same practice.  Advised patient to schedule a lab visit to check CBC, CMP, INR, ferritin   Generalized anxiety disorder -     hydrOXYzine  HCl; Take 1 tablet (25 mg total) by mouth every 8 (eight) hours as needed for anxiety.  Dispense: 60 tablet; Refill: 0  Major depressive disorder, single episode, in remission [facility]) Assessment & Plan: Continue Lexapro  10 mg.    Neuropathy Assessment & Plan: Continue gabapentin  100 mg 3 times daily.   Birth control counseling Assessment & Plan: Patient currently taking the Depo-Provera  shot, she is 1 month past due for her most recent shot.  Pregnancy test was negative.  Patient not currently sexually active.  Discussed now that she is nearing the age of starting menopause we should hold off on restarting the Depo-Provera  and monitoring her menstrual cycles.  Has been on Depo-Provera  for almost 2 years and we discussed the side effects of long-term use of Depo-Provera .  Patient in agreement.  Counseled on using condoms if she is sexually active.   Pain of plantar aspect of heel Assessment & Plan: Right foot pain for 2-1/2 weeks.  Patient is already scheduled podiatry appointment.  Seems most consistent  with plantar fasciitis.  Discussed stretching and exercises she can do at home.      Return in about 1 year (around 01/30/2025) for physical.    Laneta Pintos, MD

## 2024-01-31 NOTE — Assessment & Plan Note (Signed)
 Recommended patient resume her spironolactone  and follow-up with her gastroenterologist.  Previous gastroenterologist no longer with the practice but patient is able to see a new provider at the same practice.  Advised patient to schedule a lab visit to check CBC, CMP, INR, ferritin

## 2024-01-31 NOTE — Assessment & Plan Note (Signed)
 Right foot pain for 2-1/2 weeks.  Patient is already scheduled podiatry appointment.  Seems most consistent with plantar fasciitis.  Discussed stretching and exercises she can do at home.

## 2024-01-31 NOTE — Assessment & Plan Note (Signed)
 Patient currently taking the Depo-Provera  shot, she is 1 month past due for her most recent shot.  Pregnancy test was negative.  Patient not currently sexually active.  Discussed now that she is nearing the age of starting menopause we should hold off on restarting the Depo-Provera  and monitoring her menstrual cycles.  Has been on Depo-Provera  for almost 2 years and we discussed the side effects of long-term use of Depo-Provera .  Patient in agreement.  Counseled on using condoms if she is sexually active.

## 2024-01-31 NOTE — Assessment & Plan Note (Signed)
Continue gabapentin 100 mg 3 times daily. 

## 2024-01-31 NOTE — Assessment & Plan Note (Signed)
Continue Lexapro 10 mg

## 2024-01-31 NOTE — Patient Instructions (Addendum)
 It was nice to see you today,  We addressed the following topics today: -We will hold off on restarting your Depo-Provera .  If you are not planning to have have intercourse please wear a condom. - If you start to develop any perimenopausal vasomotor symptoms such as hot flashes let us  know and we can discuss treatment options for. - I would recommend taking your spironolactone .  I would also recommend following up with the new gastroenterologist. --Once you turn 45 you are eligible to have a colonoscopy.  There is an alternative called Cologuard in which you provide a stool sample and it is good for 3 years if it is negative.  Have a great day,  Etha Henle, MD

## 2024-02-01 ENCOUNTER — Ambulatory Visit: Payer: 59 | Admitting: Podiatry

## 2024-02-08 ENCOUNTER — Ambulatory Visit: Payer: 59 | Admitting: Podiatry

## 2024-02-15 ENCOUNTER — Ambulatory Visit (INDEPENDENT_AMBULATORY_CARE_PROVIDER_SITE_OTHER): Payer: 59

## 2024-02-15 ENCOUNTER — Ambulatory Visit (INDEPENDENT_AMBULATORY_CARE_PROVIDER_SITE_OTHER): Payer: 59 | Admitting: Podiatry

## 2024-02-15 DIAGNOSIS — M7671 Peroneal tendinitis, right leg: Secondary | ICD-10-CM

## 2024-02-15 DIAGNOSIS — M778 Other enthesopathies, not elsewhere classified: Secondary | ICD-10-CM

## 2024-02-15 MED ORDER — PREDNISONE 5 MG PO TABS: ORAL_TABLET | ORAL | 0 refills | Status: AC

## 2024-02-15 NOTE — Patient Instructions (Addendum)
 VISIT SUMMARY:  Brandi James, during your visit, we discussed the pain and swelling in your left foot that you've been experiencing since mid-January. We examined your foot and reviewed your symptoms in detail.  YOUR PLAN:  -PERONEAL TENDONITIS: Peroneal tendonitis is an inflammation of the tendons on the outside of your foot, which can cause pain and swelling. We recommend starting the prednisone to reduce inflammation, using a lace-up ankle brace for support, and performing home therapy exercises twice daily. If there is no improvement in six weeks, we may consider a walking boot or a steroid injection. Look for Voltaren gel at the pharmacy over the counter or online (also known as diclofenac 1% gel). Apply to the painful areas 3-4x daily with the supplied dosing card. Allow to dry for 10 minutes before going into socks/shoes     INSTRUCTIONS:  Please follow up in six weeks to assess your progress with the current treatment plan. If your symptoms do not improve, we will consider additional interventions such as a walking boot or a steroid injection.  Peroneal Tendinopathy Rehab Ask your health care provider which exercises are safe for you. Do exercises exactly as told by your health care provider and adjust them as directed. It is normal to feel mild stretching, pulling, tightness, or discomfort as you do these exercises. Stop right away if you feel sudden pain or your pain gets worse. Do not begin these exercises until told by your health care provider. Stretching and range-of-motion exercises These exercises warm up your muscles and joints and improve the movement and flexibility of your ankle. These exercises also help to relieve pain and stiffness. Gastroc and soleus stretch, standing  This is an exercise in which you stand on a step and use your body weight to stretch your calf muscles. To do this exercise: Stand on the edge of a step on the ball of your left / right foot. The ball of your foot  is on the walking surface, right under your toes. Keep your other foot firmly on the same step. Hold on to the wall, a railing, or a chair for balance. Slowly lift your other foot, allowing your body weight to press your left / right heel down over the edge of the step. You should feel a stretch in your left / right calf (gastrocnemius and soleus). Hold this position for 15 seconds. Return both feet to the step. Repeat this exercise with a slight bend in your left / right knee. Repeat 5 times with your left / right knee straight and 5 times with your left / right knee bent. Complete this exercise 2 times a day. Strengthening exercises These exercises build strength and endurance in your foot and ankle. Endurance is the ability to use your muscles for a long time, even after they get tired. Ankle dorsiflexion with band   Secure a rubber exercise band or tube to an object, such as a table leg, that will not move when the band is pulled. Secure the other end of the band around your left / right foot. Sit on the floor, facing the object with your left / right leg extended. The band or tube should be slightly tense when your foot is relaxed. Slowly flex your left / right ankle and toes to bring your foot toward you (dorsiflexion). Hold this position for 15 seconds. Let the band or tube slowly pull your foot back to the starting position. Repeat 5 times. Complete this exercise 2 times a day. Ankle  eversion Sit on the floor with your legs straight out in front of you. Loop a rubber exercise band or tube around the ball of your left / right foot. The ball of your foot is on the walking surface, right under your toes. Hold the ends of the band in your hands, or secure the band to a stable object. The band or tube should be slightly tense when your foot is relaxed. Slowly push your foot outward, away from your other leg (eversion). Hold this position for 15 seconds. Slowly return your foot to the  starting position. Repeat 5 times. Complete this exercise 2 times a day. Plantar flexion, standing  This exercise is sometimes called standing heel raise. Stand with your feet shoulder-width apart. Place your hands on a wall or table to steady yourself as needed, but try not to use it for support. Keep your weight spread evenly over the width of your feet while you slowly rise up on your toes (plantar flexion). If told by your health care provider: Shift your weight toward your left / right leg until you feel challenged. Stand on your left / right leg only. Hold this position for 15 seconds. Repeat 2 times. Complete this exercise 2 times a day. Single leg stand Without shoes, stand near a railing or in a doorway. You may hold on to the railing or door frame as needed. Stand on your left / right foot. Keep your big toe down on the floor and try to keep your arch lifted. Do not roll to the outside of your foot. If this exercise is too easy, you can try it with your eyes closed or while standing on a pillow. Hold this position for 15 seconds. Repeat 5 times. Complete this exercise 2 times a day. This information is not intended to replace advice given to you by your health care provider. Make sure you discuss any questions you have with your health care provider. Document Revised: 03/28/2019 Document Reviewed: 03/28/2019 Elsevier Patient Education  2020 [employer].

## 2024-02-16 NOTE — Progress Notes (Signed)
  Subjective:  Patient ID: Brandi James, female    DOB: 1981/07/11,  MRN: [id number]  Chief Complaint  Patient presents with   Foot Pain    Right foot. 3-4 weeks ago pain was in arch and heel. Used ice and rest, this helped some with initial pain and swelling. 2 weeks ago pain began on top of foot and is worse when walking or stretches. Nothing has really helped.     Discussed the use of AI scribe software for clinical note transcription with the patient, who gave verbal consent to proceed.  History of Present Illness   Brandi James is a 43 year old female with cirrhosis who presents with left foot pain and swelling.  She has been experiencing sharp pain in her left foot since mid-January. The pain is severe, especially with the first step in the morning, and initially made walking difficult for almost a week. Although the pain has decreased, it persists, particularly when pressure is applied to the foot, such as during uphill walking, which she does every morning.  The pain initially started on the outside of the foot and was more pronounced in the front of the heel and under the arch. Occasional pain is felt at the back of the heel but is less significant. Weight-bearing activities, especially on uneven ground, exacerbate the pain.  She has not taken anti-inflammatory medications due to her history of cirrhosis, and she avoids Tylenol or acetaminophen because of her liver condition. She wears insoles to help with neuropathy, which she finds beneficial. No history of kidney failure.          Objective:    Physical Exam   EXTREMITIES: Palpable pulses in the foot, which is warm and profuse. Pain on palpation at the fifth metatarsal base and along the peroneal sulcus into the arch.       No images are attached to the encounter.    Results   RADIOLOGY X-ray right foot: No acute fracture or stress fracture (02/15/2024)      Assessment:   1. Peroneal tendinitis of right lower  extremity      Plan:  Patient was evaluated and treated and all questions answered.  Assessment and Plan    Peroneal Tendonitis Pain and swelling in the left foot, particularly on the outside and under the arch. Pain is worse when walking uphill. Physical examination revealed tenderness at the insertion of the peroneus brevis and along the peroneal sulcus into the arch. X-rays showed no acute or stress fracture. -Start Prednisone taper for inflammation. -Use a lace-up ankle brace for support. -Perform home therapy exercises twice daily. -Follow-up in six weeks. If no improvement, consider moving to a walking boot and possibly a steroid injection at the tendon insertion.  Neuropathy Mentioned in the context of shoe comfort. No further details provided in the conversation. -No specific plan discussed in the conversation.          No follow-ups on file.

## 2024-03-28 ENCOUNTER — Ambulatory Visit: Payer: 59 | Admitting: Podiatry

## 2024-04-10 ENCOUNTER — Ambulatory Visit: Admitting: Podiatry

## 2024-07-22 ENCOUNTER — Other Ambulatory Visit: Payer: Self-pay | Admitting: Family Medicine

## 2024-07-22 DIAGNOSIS — G629 Polyneuropathy, unspecified: Secondary | ICD-10-CM

## 2024-12-30 ENCOUNTER — Other Ambulatory Visit: Payer: Self-pay | Admitting: Family Medicine

## 2024-12-30 DIAGNOSIS — F411 Generalized anxiety disorder: Secondary | ICD-10-CM

## 2025-01-01 NOTE — Telephone Encounter (Signed)
 LVM for patient to return the call to schedule appointment. Last appt was 2/25 need physical scheduled for after 01/30/25.

## 2025-01-01 NOTE — Telephone Encounter (Signed)
 Needs an appoint for refills beyond this one.  Last seen February of last year.
